# Patient Record
Sex: Female | Born: 1970 | Race: White | Hispanic: No | Marital: Single | Smoking: Never smoker
Health system: Southern US, Community
[De-identification: ages and names within clinical notes are randomized; demographics above are authoritative.]

## PROBLEM LIST (undated history)

## (undated) DIAGNOSIS — N289 Disorder of kidney and ureter, unspecified: Secondary | ICD-10-CM

## (undated) DIAGNOSIS — Z8719 Personal history of other diseases of the digestive system: Secondary | ICD-10-CM

## (undated) DIAGNOSIS — Z8709 Personal history of other diseases of the respiratory system: Secondary | ICD-10-CM

## (undated) DIAGNOSIS — L900 Lichen sclerosus et atrophicus: Secondary | ICD-10-CM

## (undated) DIAGNOSIS — N200 Calculus of kidney: Secondary | ICD-10-CM

## (undated) DIAGNOSIS — M109 Gout, unspecified: Secondary | ICD-10-CM

## (undated) DIAGNOSIS — E559 Vitamin D deficiency, unspecified: Secondary | ICD-10-CM

## (undated) HISTORY — DX: Gout, unspecified: M10.9

## (undated) HISTORY — PX: KIDNEY STONE SURGERY: SHX686

## (undated) HISTORY — DX: Personal history of other diseases of the digestive system: Z87.19

## (undated) HISTORY — DX: Personal history of other diseases of the respiratory system: Z87.09

## (undated) HISTORY — DX: Lichen sclerosus et atrophicus: L90.0

## (undated) HISTORY — DX: Vitamin D deficiency, unspecified: E55.9

---

## 2003-01-09 ENCOUNTER — Ambulatory Visit (HOSPITAL_BASED_OUTPATIENT_CLINIC_OR_DEPARTMENT_OTHER): Admission: RE | Admit: 2003-01-09 | Discharge: 2003-01-09 | Payer: Self-pay | Admitting: Urology

## 2004-09-23 ENCOUNTER — Other Ambulatory Visit: Admission: RE | Admit: 2004-09-23 | Discharge: 2004-09-23 | Payer: Self-pay | Admitting: Obstetrics and Gynecology

## 2006-07-06 ENCOUNTER — Encounter: Admission: RE | Admit: 2006-07-06 | Discharge: 2006-07-06 | Payer: Self-pay | Admitting: Orthopedic Surgery

## 2011-11-01 ENCOUNTER — Encounter: Payer: Self-pay | Admitting: Obstetrics and Gynecology

## 2012-05-17 ENCOUNTER — Telehealth: Payer: Self-pay | Admitting: Obstetrics and Gynecology

## 2012-05-17 ENCOUNTER — Encounter: Payer: Self-pay | Admitting: Obstetrics and Gynecology

## 2012-05-17 NOTE — Telephone Encounter (Signed)
TC to pt. Regarding fax request for RF Junel. Pt needs to schedule annual. TC to pt. LM to return call.

## 2012-05-17 NOTE — Progress Notes (Signed)
Pt has annual scheduled 06/07/12. Per protocol Rx for Junel Fe 1 po qd one pack with 0 Rf faxed to [facility], [address].

## 2012-05-17 NOTE — Telephone Encounter (Signed)
Returned pt's call,. States needs Rf for Junel by 05/20/12. Denies any problems. Transferred to appt to schedule annual and then will Rf until that time.

## 2014-07-22 ENCOUNTER — Other Ambulatory Visit: Payer: Self-pay | Admitting: Physician Assistant

## 2014-07-22 ENCOUNTER — Ambulatory Visit
Admission: RE | Admit: 2014-07-22 | Discharge: 2014-07-22 | Disposition: A | Discharge status: Home / Self Care | Payer: BC Managed Care – PPO | Source: Ambulatory Visit | Attending: Physician Assistant | Admitting: Physician Assistant

## 2014-07-22 DIAGNOSIS — R109 Unspecified abdominal pain: Secondary | ICD-10-CM

## 2018-01-01 DIAGNOSIS — F331 Major depressive disorder, recurrent, moderate: Secondary | ICD-10-CM

## 2018-01-01 DIAGNOSIS — F411 Generalized anxiety disorder: Secondary | ICD-10-CM

## 2018-01-01 DIAGNOSIS — F422 Mixed obsessional thoughts and acts: Secondary | ICD-10-CM | POA: Insufficient documentation

## 2018-01-09 ENCOUNTER — Other Ambulatory Visit: Payer: Self-pay | Admitting: Physician Assistant

## 2018-01-09 MED ORDER — FLUOXETINE HCL 60 MG PO TABS: 60.0000 mg | ORAL_TABLET | Freq: Every day | ORAL | 0 refills | Status: DC

## 2018-01-15 ENCOUNTER — Ambulatory Visit: Payer: BC Managed Care – PPO | Admitting: Physician Assistant

## 2018-01-15 ENCOUNTER — Encounter: Payer: Self-pay | Admitting: Physician Assistant

## 2018-01-15 DIAGNOSIS — F411 Generalized anxiety disorder: Secondary | ICD-10-CM | POA: Diagnosis not present

## 2018-01-15 DIAGNOSIS — F331 Major depressive disorder, recurrent, moderate: Secondary | ICD-10-CM

## 2018-01-15 DIAGNOSIS — F429 Obsessive-compulsive disorder, unspecified: Secondary | ICD-10-CM

## 2018-01-15 NOTE — Progress Notes (Signed)
[facility]  Patient ID: Jessica Edwards,  MRN: [id number]  PCP: Juluis Rainier, MD  Date of Evaluation: 01/15/2018 Time spent:15 minutes  Chief Complaint:  Chief Complaint    Follow-up      HISTORY/CURRENT STATUS: HPI For 6 week med check.  Has been doing well.  Is really stressed with work. 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  Sleeps fairly well.  Slept a lot this weekend, but thinks she needed the rest.  Patient denies loss of interest in usual activities and is able to enjoy things.  Denies decreased energy or motivation.  Appetite has not changed.  No extreme sadness, tearfulness, or feelings of hopelessness.  Denies any changes in concentration, making decisions or remembering things.  Denies suicidal or homicidal thoughts. At the last visit, we had increased the Wellbutrin.  She states it is helped with her energy and motivation.   OCD symptoms are pretty well controlled.  Still like things in order but not to the point where it is taking a lot of time out of her day.  Individual Medical History/ Review of Systems: Changes? :No   Allergies: Augmentin [amoxicillin-pot clavulanate]; Codeine; and Doxycycline  Current Medications:  Current Outpatient Medications:  .  buPROPion (WELLBUTRIN XL) 150 MG 24 hr tablet, Take 150 mg by mouth every morning., Disp: , Rfl:  .  buPROPion (WELLBUTRIN XL) 300 MG 24 hr tablet, Take 300 mg by mouth daily. Take with 150 mg tab, Disp: , Rfl:  .  cetirizine (ZYRTEC) 10 MG tablet, Take 10 mg by mouth daily., Disp: , Rfl:  .  FLUoxetine HCl 60 MG TABS, Take 60 mg by mouth daily., Disp: 90 tablet, Rfl: 0 .  spironolactone (ALDACTONE) 25 MG tablet, Take 25 mg by mouth daily., Disp: , Rfl:  Medication Side Effects: none  Family Medical/ Social History: Changes? No  MENTAL HEALTH EXAM:  There were no vitals  taken for this visit.There is no height or weight on file to calculate BMI.  General Appearance: Casual  Eye Contact:  Good  Speech:  Clear and Coherent  Volume:  Normal  Mood:  Euthymic  Affect:  Appropriate  Thought Process:  Coherent  Orientation:  Full (Time, Place, and Person)  Thought Content: Logical   Suicidal Thoughts:  No  Homicidal Thoughts:  No  Memory:  WNL  Judgement:  Good  Insight:  Good  Psychomotor Activity:  Normal  Concentration:  Concentration: Good  Recall:  Good  Fund of Knowledge: Good  Language: Good  Assets:  Desire for Improvement  ADL's:  Intact  Cognition: WNL  Prognosis:  Good    DIAGNOSES:    ICD-10-CM   1. Generalized anxiety disorder F41.1   2. Major depressive disorder, recurrent episode, moderate (HCC) F33.1   3. Obsessive-compulsive disorder, unspecified type F42.9     Receiving Psychotherapy: No    RECOMMENDATIONS: Continue medications as noted above. Return in 3 months or sooner as needed.   Melony Overly, PA-C

## 2018-01-22 ENCOUNTER — Emergency Department (HOSPITAL_COMMUNITY)
Admission: EM | Admit: 2018-01-22 | Discharge: 2018-01-22 | Disposition: A | Discharge status: Home / Self Care | Payer: BC Managed Care – PPO | Attending: Emergency Medicine | Admitting: Emergency Medicine

## 2018-01-22 ENCOUNTER — Other Ambulatory Visit: Payer: Self-pay

## 2018-01-22 ENCOUNTER — Encounter (HOSPITAL_COMMUNITY): Payer: Self-pay | Admitting: Emergency Medicine

## 2018-01-22 DIAGNOSIS — N289 Disorder of kidney and ureter, unspecified: Secondary | ICD-10-CM | POA: Insufficient documentation

## 2018-01-22 DIAGNOSIS — S61411A Laceration without foreign body of right hand, initial encounter: Secondary | ICD-10-CM | POA: Diagnosis present

## 2018-01-22 DIAGNOSIS — Y939 Activity, unspecified: Secondary | ICD-10-CM | POA: Insufficient documentation

## 2018-01-22 DIAGNOSIS — Y929 Unspecified place or not applicable: Secondary | ICD-10-CM | POA: Diagnosis not present

## 2018-01-22 DIAGNOSIS — Z79899 Other long term (current) drug therapy: Secondary | ICD-10-CM | POA: Diagnosis not present

## 2018-01-22 DIAGNOSIS — W25XXXA Contact with sharp glass, initial encounter: Secondary | ICD-10-CM | POA: Diagnosis not present

## 2018-01-22 DIAGNOSIS — Z23 Encounter for immunization: Secondary | ICD-10-CM | POA: Insufficient documentation

## 2018-01-22 DIAGNOSIS — Y999 Unspecified external cause status: Secondary | ICD-10-CM | POA: Diagnosis not present

## 2018-01-22 HISTORY — DX: Disorder of kidney and ureter, unspecified: N28.9

## 2018-01-22 HISTORY — DX: Calculus of kidney: N20.0

## 2018-01-22 MED ORDER — BACITRACIN ZINC 500 UNIT/GM EX OINT
TOPICAL_OINTMENT | CUTANEOUS | Status: AC
  Administered 2018-01-22: 02:00:00
  Filled 2018-01-22: qty 0.9

## 2018-01-22 MED ORDER — TETANUS-DIPHTH-ACELL PERTUSSIS 5-2.5-18.5 LF-MCG/0.5 IM SUSP
0.5000 mL | Freq: Once | INTRAMUSCULAR | Status: AC
  Administered 2018-01-22: 0.5 mL via INTRAMUSCULAR
  Filled 2018-01-22: qty 0.5

## 2018-01-22 MED ORDER — LIDOCAINE-EPINEPHRINE (PF) 2 %-1:200000 IJ SOLN: 20.0000 mL | Freq: Once | INTRAMUSCULAR | Status: DC

## 2018-01-22 MED ORDER — LIDOCAINE HCL 2 % IJ SOLN
INTRAMUSCULAR | Status: AC
  Administered 2018-01-22: 400 mg
  Filled 2018-01-22: qty 20

## 2018-01-22 NOTE — ED Provider Notes (Signed)
[facility]-EMERGENCY DEPT Provider Note   CSN: [id number] Arrival date & time: 01/22/18  0005     History   Chief Complaint Chief Complaint  Patient presents with  . Laceration    R Hand    HPI Jessica Edwards is a 47 y.o. female.  Patient presents to the emergency department with a chief complaint of right hand laceration.  She states that she cut her hand on a glass mug that had been broken.  She sustained a laceration on the back of her right hand.  She denies any numbness or tingling.  Denies any difficulty moving her fingers.  Last tetanus shot unknown.  Bleeding controlled with gauze.  The history is provided by the patient. No language interpreter was used.    Past Medical History:  Diagnosis Date  . Kidney stones   . Renal disorder     Patient Active Problem List   Diagnosis Date Noted  . Major depressive disorder, recurrent episode, moderate (HCC) 01/01/2018  . Mixed obsessional thoughts and acts 01/01/2018  . GAD (generalized anxiety disorder) 01/01/2018    History reviewed. No pertinent surgical history.   OB History   None      Home Medications    Prior to Admission medications   Medication Sig Start Date End Date Taking? Authorizing Provider  buPROPion (WELLBUTRIN XL) 150 MG 24 hr tablet Take 150 mg by mouth every morning. 10/19/17   [provider]  buPROPion (WELLBUTRIN XL) 300 MG 24 hr tablet Take 300 mg by mouth daily. Take with 150 mg tab 10/19/17   [provider]  cetirizine (ZYRTEC) 10 MG tablet Take 10 mg by mouth daily.    [provider]  FLUoxetine HCl 60 MG TABS Take 60 mg by mouth daily. 01/09/18   Cherie OuchHurst, Teresa T, PA-C  spironolactone (ALDACTONE) 25 MG tablet Take 25 mg by mouth daily.    [provider]    Family History No family history on file.  Social History Social History   Tobacco Use  . Smoking status: Never Smoker  . Smokeless tobacco: Never Used  Substance  Use Topics  . Alcohol use: Yes    Comment: maybe 1-2 year  . Drug use: Never     Allergies   Augmentin [amoxicillin-pot clavulanate]; Codeine; and Doxycycline   Review of Systems Review of Systems  All other systems reviewed and are negative.    Physical Exam Updated Vital Signs BP (!) 148/104 (BP Location: Right Arm)   Pulse 88   Temp 97.8 F (36.6 C) (Oral)   Resp 16   SpO2 99%   Physical Exam  Constitutional: She is oriented to person, place, and time. No distress.  HENT:  Head: Normocephalic and atraumatic.  Eyes: Pupils are equal, round, and reactive to light. Conjunctivae and EOM are normal.  Neck: No tracheal deviation present.  Cardiovascular: Normal rate.  Pulmonary/Chest: Effort normal. No respiratory distress.  Abdominal: Soft.  Musculoskeletal: Normal range of motion.  Range of motion and strength of right fingers 5/5, isolated at all joints  Neurological: She is alert and oriented to person, place, and time.  Skin: Skin is warm and dry. She is not diaphoretic.  3 cm laceration to dorsal aspect of right hand, fairly shallow, no deep structure involvement, no foreign body  Psychiatric: Judgment normal.  Nursing note and vitals reviewed.    ED Treatments / Results  Labs (all labs ordered are listed, but only abnormal results are displayed)  Labs Reviewed - No data to display  EKG None  Radiology No results found.  Procedures Procedures (including critical care time) LACERATION REPAIR Performed by: Roxy Horseman Authorized by: Roxy Horseman Consent: Verbal consent obtained. Risks and benefits: risks, benefits and alternatives were discussed Consent given by: patient Patient identity confirmed: provided demographic data Prepped and Draped in normal sterile fashion Wound explored  Laceration Location: right hand  Laceration Length: 2 cm  No Foreign Bodies seen or palpated  Anesthesia: local infiltration  Local anesthetic: lidocaine  2% with epinephrine  Anesthetic total: 2 ml  Irrigation method: syringe Amount of cleaning: standard  Skin closure: 5-0 prolene  Number of sutures: 5  Technique: interrupted  Patient tolerance: Patient tolerated the procedure well with no immediate complications.  Medications Ordered in ED Medications  Tdap (BOOSTRIX) injection 0.5 mL (has no administration in time range)  lidocaine-EPINEPHrine (XYLOCAINE W/EPI) 2 %-1:200000 (PF) injection 20 mL (has no administration in time range)     Initial Impression / Assessment and Plan / ED Course  I have reviewed the triage vital signs and the nursing notes.  Pertinent labs & imaging results that were available during my care of the patient were reviewed by me and considered in my medical decision making (see chart for details).     Patient with minor laceration to right hand.  Laceration repaired in the ED.  Tetanus shot updated.  Final Clinical Impressions(s) / ED Diagnoses   Final diagnoses:  Laceration of right hand without foreign body, initial encounter    ED Discharge Orders    None       Roxy Horseman, PA-C 01/22/18 0123    Dione Booze, MD 01/22/18 [phone]

## 2018-01-22 NOTE — ED Triage Notes (Signed)
Patient states she was helping her father with the trash when she cut her posterior hand on a broken mug. Laceration is 2 cm long and 0.5 cm wide. Patient is able to move thumb and has feeling. Minimal blood loss. Patient will need new tetanus shot.

## 2018-04-11 ENCOUNTER — Encounter: Payer: Self-pay | Admitting: Physician Assistant

## 2018-04-11 ENCOUNTER — Ambulatory Visit: Payer: BC Managed Care – PPO | Admitting: Physician Assistant

## 2018-04-11 DIAGNOSIS — F411 Generalized anxiety disorder: Secondary | ICD-10-CM

## 2018-04-11 DIAGNOSIS — F331 Major depressive disorder, recurrent, moderate: Secondary | ICD-10-CM

## 2018-04-11 DIAGNOSIS — F422 Mixed obsessional thoughts and acts: Secondary | ICD-10-CM | POA: Diagnosis not present

## 2018-04-11 MED ORDER — BUPROPION HCL ER (XL) 150 MG PO TB24: 450.0000 mg | ORAL_TABLET | Freq: Every day | ORAL | 1 refills | Status: DC

## 2018-04-11 MED ORDER — FLUOXETINE HCL 40 MG PO CAPS: 80.0000 mg | ORAL_CAPSULE | Freq: Every day | ORAL | 1 refills | Status: DC

## 2018-04-11 NOTE — Progress Notes (Signed)
[facility]  Patient ID: Jessica Edwards,  MRN: [id number]  PCP: Juluis Rainier, MD  Date of Evaluation: 04/11/2018 Time spent:15 minutes  Chief Complaint:  Chief Complaint    Follow-up      HISTORY/CURRENT STATUS: HPI Here for routine med check.   Has been really stressed lately.  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 Has been putting it off.   Patient has lost interest in usual activities is unable to enjoy things.  Has decreased energy and motivation.  Appetite has not changed.  No extreme sadness, tearfulness, or feelings of hopelessness.  Denies any changes in concentration, making decisions or remembering things.  Denies suicidal or homicidal thoughts.   Individual Medical History/ Review of Systems: Changes? :No    Past medications for mental health diagnoses include: Cymbalta, Xanax  Allergies: Augmentin [amoxicillin-pot clavulanate]; Codeine; and Doxycycline  Current Medications:  Current Outpatient Medications:  .  buPROPion (WELLBUTRIN XL) 150 MG 24 hr tablet, Take 150 mg by mouth every morning., Disp: , Rfl:  .  buPROPion (WELLBUTRIN XL) 300 MG 24 hr tablet, Take 300 mg by mouth daily. Take with 150 mg tab, Disp: , Rfl:  .  cetirizine (ZYRTEC) 10 MG tablet, Take 10 mg by mouth daily., Disp: , Rfl:  .  FLUoxetine HCl 60 MG TABS, Take 60 mg by mouth daily., Disp: 90 tablet, Rfl: 0 .  spironolactone (ALDACTONE) 25 MG tablet, Take 25 mg by mouth daily., Disp: , Rfl:  Medication Side Effects: none  Family Medical/ Social History: Changes? No  MENTAL HEALTH EXAM:  There were no vitals taken for this visit.There is no height or weight on file to calculate BMI.  General Appearance: Casual and Well Groomed  Eye Contact:  Good  Speech:  Clear and Coherent  Volume:  Normal  Mood:  Euthymic  Affect:  Appropriate  Thought Process:  Goal Directed  Orientation:  Full (Time, Place, and Person)  Thought Content: Logical   Suicidal  Thoughts:  No  Homicidal Thoughts:  No  Memory:  WNL  Judgement:  Good  Insight:  Good  Psychomotor Activity:  Normal  Concentration:  Concentration: Good  Recall:  Good  Fund of Knowledge: Good  Language: Good  Assets:  Desire for Improvement  ADL's:  Intact  Cognition: WNL  Prognosis:  Good    DIAGNOSES:    ICD-10-CM   1. Major depressive disorder, recurrent episode, moderate (HCC) F33.1   2. Mixed obsessional thoughts and acts F42.2   3. GAD (generalized anxiety disorder) F41.1     Receiving Psychotherapy: No    RECOMMENDATIONS: Increase Prozac to 80mg  qs. Cont Wellbutrin XL 450mg  q am.  If still having increased fatigue and low motivation in a month, will add Abilify. Recommend get back in therapy. Return in 4 weeks.    Melony Overly, PA-C

## 2018-04-11 NOTE — Patient Instructions (Signed)
Continue Wellbutrin XL 450 mg daily. Increase Prozac to a total of 80 mg daily.  We will use that for a month to 6 weeks and if there are still no improvement with mood and energy then we will consider adding Abilify at a low dose.

## 2018-05-03 ENCOUNTER — Telehealth: Payer: Self-pay | Admitting: Physician Assistant

## 2018-05-03 NOTE — Telephone Encounter (Signed)
Decrease Prozac back to 40mg .  Since she'll see me next week, it's not necessary to go to 60mg , and send in that Rx....she and I can decide that next week. Has she seen her PCP?  She needs to do that too.

## 2018-05-03 NOTE — Telephone Encounter (Signed)
Patient called and said that she is experiencing dizzyness and tripping which she has never done. Patient feels like this is due to the increase of the prozac. Please give patient a call at [phone]. Next appt is march 4th

## 2018-05-04 NOTE — Telephone Encounter (Signed)
Pt given recommendation and verbalized understanding. Instructed to go to ER if sx's get worse over weekend.

## 2018-05-07 ENCOUNTER — Ambulatory Visit: Payer: BC Managed Care – PPO | Admitting: Physician Assistant

## 2018-05-09 ENCOUNTER — Ambulatory Visit: Payer: BC Managed Care – PPO | Admitting: Physician Assistant

## 2018-05-09 VITALS — BP 152/74 | HR 71

## 2018-05-09 DIAGNOSIS — F331 Major depressive disorder, recurrent, moderate: Secondary | ICD-10-CM | POA: Diagnosis not present

## 2018-05-09 DIAGNOSIS — F411 Generalized anxiety disorder: Secondary | ICD-10-CM | POA: Diagnosis not present

## 2018-05-09 DIAGNOSIS — F422 Mixed obsessional thoughts and acts: Secondary | ICD-10-CM

## 2018-05-09 MED ORDER — FLUOXETINE HCL 40 MG PO CAPS: 80.0000 mg | ORAL_CAPSULE | Freq: Every day | ORAL | 1 refills | Status: DC

## 2018-05-09 NOTE — Patient Instructions (Signed)
Increase Prozac to 40 mg, 2 daily.

## 2018-05-09 NOTE — Progress Notes (Signed)
[facility]  Patient ID: Jessica Edwards,  MRN: [id number]  PCP: Juluis Rainier, MD  Date of Evaluation: 05/09/18 Time spent:15 minutes  Chief Complaint:  Chief Complaint    Follow-up      HISTORY/CURRENT STATUS: HPI  Here for routine med check.  Since LOV, had 'off-balance' episodes and called [place]. We decreased the Prozac back.   She also had started back taking the Spironolactone at the same time, after having been off for several months. Takes it for acne. No syncope.  States she feels much better as far as the imbalance goes.  She is never had that before when she was on the Spironolactone or when she first started the Prozac.  States she was feeling better with motivation and energy and enjoying things more when we increased the Prozac.  "It is the same we had to decrease the dose again."  Right now, she is not feeling as bad as she did before the last visit in February but she can tell she is not on the higher dose of the Prozac.  She denies any suicidal thoughts.  He sleeps well most of the time.  States her mom who has dementia may be getting worse.  That of course is stressful and sad.  Patient is not obsessing over things as much as she was.  No constant worry over things she cannot control  Denies muscle or joint pain, stiffness, or dystonia.  Denies dizziness, syncope, seizures, numbness, tingling, tremor, tics, unsteady gait, slurred speech, confusion.   Individual Medical History/ Review of Systems: Changes? :No    Past medications for mental health diagnoses include: Cymbalta, Xanax  Allergies: Augmentin [amoxicillin-pot clavulanate]; Codeine; and Doxycycline  Current Medications:  Current Outpatient Medications:  .  buPROPion (WELLBUTRIN XL) 150 MG 24 hr tablet, Take 3 tablets (450 mg total) by mouth daily., Disp: 270 tablet, Rfl: 1 .  cetirizine (ZYRTEC) 10 MG tablet, Take 10 mg by mouth daily., Disp: , Rfl:  .  FLUoxetine (PROZAC) 40 MG capsule,  Take 2 capsules (80 mg total) by mouth daily., Disp: 180 capsule, Rfl: 1 .  spironolactone (ALDACTONE) 25 MG tablet, Take 25 mg by mouth daily., Disp: , Rfl:  Medication Side Effects: none  Family Medical/ Social History: Changes? No  MENTAL HEALTH EXAM:  Blood pressure (!) 152/74, pulse 71.There is no height or weight on file to calculate BMI.  General Appearance: Casual and Well Groomed  Eye Contact:  Good  Speech:  Clear and Coherent  Volume:  Normal  Mood:  Euthymic  Affect:  Appropriate  Thought Process:  Goal Directed  Orientation:  Full (Time, Place, and Person)  Thought Content: Logical   Suicidal Thoughts:  No  Homicidal Thoughts:  No  Memory:  WNL  Judgement:  Good  Insight:  Good  Psychomotor Activity:  Normal  Concentration:  Concentration: Good  Recall:  Good  Fund of Knowledge: Good  Language: Good  Assets:  Desire for Improvement  ADL's:  Intact  Cognition: WNL  Prognosis:  Good    DIAGNOSES:    ICD-10-CM   1. Major depressive disorder, recurrent episode, moderate (HCC) F33.1   2. GAD (generalized anxiety disorder) F41.1   3. Mixed obsessional thoughts and acts F42.2     Receiving Psychotherapy: No    RECOMMENDATIONS: I recommend that we will retry the Prozac at the higher dose.  My gut feeling is that when she started the spironolactone back that she may have had some orthostatic  hypotension due to that medication.  Of course there is no way to prove that.  She is willing to retry the Prozac at the higher dose because she did feel much better.  She should let me know if those symptoms recur and she can just decrease back down to 40 mg of the Prozac, or 60 mg would be better.  She will need to call so I can send in a 20 mg pill for her to take in addition to the 40. Continue Wellbutrin XL 450 mg p.o. daily. Return in 6 weeks.  Melony Overly, PA-C   This record has been created using [employer].  Chart creation errors have been sought, but may not  always have been located and corrected. Such creation errors do not reflect on the standard of medical care.

## 2018-05-10 ENCOUNTER — Encounter: Payer: Self-pay | Admitting: Physician Assistant

## 2018-06-20 ENCOUNTER — Encounter: Payer: Self-pay | Admitting: Physician Assistant

## 2018-06-20 ENCOUNTER — Ambulatory Visit (INDEPENDENT_AMBULATORY_CARE_PROVIDER_SITE_OTHER): Payer: BC Managed Care – PPO | Admitting: Physician Assistant

## 2018-06-20 ENCOUNTER — Other Ambulatory Visit: Payer: Self-pay

## 2018-06-20 DIAGNOSIS — F331 Major depressive disorder, recurrent, moderate: Secondary | ICD-10-CM | POA: Diagnosis not present

## 2018-06-20 DIAGNOSIS — F429 Obsessive-compulsive disorder, unspecified: Secondary | ICD-10-CM | POA: Diagnosis not present

## 2018-06-20 DIAGNOSIS — F411 Generalized anxiety disorder: Secondary | ICD-10-CM

## 2018-06-20 NOTE — Progress Notes (Signed)
[facility]  Patient ID: Jessica Edwards,  MRN: [id number]  PCP: Juluis Rainier, MD  Date of Evaluation: 06/20/2018 Time spent:15 minutes  Chief Complaint:  Chief Complaint    Follow-up     Virtual Visit via Telephone Note  I connected with Jessica Edwards on 06/20/18 at  4:30 PM EDT by telephone and verified that I am speaking with the correct person using two identifiers.   I discussed the limitations, risks, security and privacy concerns of performing an evaluation and management service by telephone and the availability of in person appointments. I also discussed with the patient that there may be a patient responsible charge related to this service. The patient expressed understanding and agreed to proceed.   HISTORY/CURRENT STATUS: HPI For 6 week med check.  While going over her meds, we realized she's taking the Prozac wrong, a total of 160mg /day. Has felt dizzy and h/a, nauseated.  She's been taking it for 6 weeks, since the last visit.  Patient says the good thing about it is that she has felt more motivated and energetic.  When she feels good physically, she has even vacuums the floor and major dishes got in the dishwasher like they are supposed to.  She denies any suicidal thoughts.  She was able to finish the big project that she had to do for her career.  She is glad to have that behind her.  She is not isolating except as required because of the coronavirus pandemic.  She is not crying easily.  Denies muscle or joint pain, stiffness, or dystonia.  Denies syncope, seizures, numbness, tingling, tremor, tics, unsteady gait, slurred speech, confusion.   Individual Medical History/ Review of Systems: Changes? :Yes  see HPI   Past medications for mental health diagnoses include: Cymbalta, Xanax  Allergies: Augmentin [amoxicillin-pot clavulanate]; Codeine; and Doxycycline  Current Medications:  Current Outpatient Medications:  .  buPROPion (WELLBUTRIN  XL) 150 MG 24 hr tablet, Take 3 tablets (450 mg total) by mouth daily., Disp: 270 tablet, Rfl: 1 .  cetirizine (ZYRTEC) 10 MG tablet, Take 10 mg by mouth daily., Disp: , Rfl:  .  FLUoxetine (PROZAC) 40 MG capsule, Take 2 capsules (80 mg total) by mouth daily., Disp: 180 capsule, Rfl: 1 .  spironolactone (ALDACTONE) 25 MG tablet, Take 25 mg by mouth daily., Disp: , Rfl:  Medication Side Effects: none except w/ high dose Prozac  Family Medical/ Social History: Changes?  Working from home now because of the coronavirus pandemic  MENTAL HEALTH EXAM:  There were no vitals taken for this visit.There is no height or weight on file to calculate BMI.  General Appearance: Phone visit, unable to assess  Eye Contact:  Unable to assess  Speech:  Clear and Coherent  Volume:  Normal  Mood:  Euthymic  Affect:  Unable to assess  Thought Process:  Goal Directed  Orientation:  Full (Time, Place, and Person)  Thought Content: Logical   Suicidal Thoughts:  No  Homicidal Thoughts:  No  Memory:  WNL  Judgement:  Good  Insight:  Good  Psychomotor Activity:  Unable to assess  Concentration:  Concentration: Good  Recall:  Good  Fund of Knowledge: Good  Language: Good  Assets:  Desire for Improvement  ADL's:  Intact  Cognition: WNL  Prognosis:  Good    DIAGNOSES:    ICD-10-CM   1. Major depressive disorder, recurrent episode, moderate (HCC) F33.1   2. Obsessive-compulsive disorder, unspecified type F42.9   3.  GAD (generalized anxiety disorder) F41.1     Receiving Psychotherapy: No    RECOMMENDATIONS: Stop Prozac for 2 weeks and then restart at 80 mg.  She verbalizes understanding.  Call if there are problems. Continue Wellbutrin XL 450 mg daily. Return in 4 weeks.  Melony Overlyeresa Darbie Biancardi, PA-C   This record has been created using [employer] software.  Chart creation errors have been sought, but may not always have been located and corrected. Such creation errors do not reflect on the standard of medical  care.

## 2018-06-21 ENCOUNTER — Other Ambulatory Visit: Payer: Self-pay | Admitting: Physician Assistant

## 2018-07-17 ENCOUNTER — Ambulatory Visit (INDEPENDENT_AMBULATORY_CARE_PROVIDER_SITE_OTHER): Payer: BC Managed Care – PPO | Admitting: Physician Assistant

## 2018-07-17 ENCOUNTER — Other Ambulatory Visit: Payer: Self-pay

## 2018-07-17 ENCOUNTER — Encounter: Payer: Self-pay | Admitting: Physician Assistant

## 2018-07-17 DIAGNOSIS — F331 Major depressive disorder, recurrent, moderate: Secondary | ICD-10-CM | POA: Diagnosis not present

## 2018-07-17 DIAGNOSIS — F429 Obsessive-compulsive disorder, unspecified: Secondary | ICD-10-CM

## 2018-07-17 NOTE — Progress Notes (Signed)
[facility]  Patient ID: Jessica Edwards,  MRN: [id number]  PCP: Juluis Rainier, MD  Date of Evaluation: 07/17/2018 Time spent:15 minutes  Chief Complaint:  Chief Complaint    Follow-up     Virtual Visit via Telephone Note  I connected with patient by a video enabled telemedicine application or telephone, with their informed consent, and verified patient privacy and that I am speaking with the correct person using two identifiers.  I am private, in my home and the patient is home.   I discussed the limitations, risks, security and privacy concerns of performing an evaluation and management service by telephone and the availability of in person appointments. I also discussed with the patient that there may be a patient responsible charge related to this service. The patient expressed understanding and agreed to proceed.   I discussed the assessment and treatment plan with the patient. The patient was provided an opportunity to ask questions and all were answered. The patient agreed with the plan and demonstrated an understanding of the instructions.   The patient was advised to call back or seek an in-person evaluation if the symptoms worsen or if the condition fails to improve as anticipated.  I provided 15 minutes of non-face-to-face time during this encounter.  HISTORY/CURRENT STATUS: HPI for 1 month med check.  She was accidentally taking double the dose of Prozac, we discovered at the last appt.  We decreased it and now she feels much better.  Not dizzy and feeling sluggish and unbalanced.   Patient denies loss of interest in usual activities and is able to enjoy things.  Denies decreased energy or motivation.  Appetite has not changed.  No extreme sadness, tearfulness, or feelings of hopelessness.  Denies any changes in concentration, making decisions or remembering things.  Denies suicidal or homicidal thoughts.  Anxiety is well controlled. Sleeps well.  Feels  that OCD symptoms are pretty well controlled.  Denies dizziness, syncope, seizures, numbness, tingling, tremor, tics, unsteady gait, slurred speech, confusion.  Denies muscle or joint pain, stiffness, or dystonia.  Individual Medical History/ Review of Systems: Changes? :No    Past medications for mental health diagnoses include: Cymbalta, Xanax  Allergies: Augmentin [amoxicillin-pot clavulanate]; Codeine; and Doxycycline  Current Medications:  Current Outpatient Medications:  .  buPROPion (WELLBUTRIN XL) 150 MG 24 hr tablet, Take 3 tablets (450 mg total) by mouth daily., Disp: 270 tablet, Rfl: 1 .  cetirizine (ZYRTEC) 10 MG tablet, Take 10 mg by mouth daily., Disp: , Rfl:  .  FLUoxetine (PROZAC) 40 MG capsule, Take 2 capsules (80 mg total) by mouth daily., Disp: 180 capsule, Rfl: 1 .  spironolactone (ALDACTONE) 25 MG tablet, Take 25 mg by mouth daily., Disp: , Rfl:  Medication Side Effects: none  Family Medical/ Social History: Changes? Yes working from home due to coronavirus pandemic  MENTAL HEALTH EXAM:  There were no vitals taken for this visit.There is no height or weight on file to calculate BMI.  General Appearance: unable to assess  Eye Contact:  unable to assess  Speech:  Clear and Coherent  Volume:  Normal  Mood:  Euthymic  Affect:  unable to assess  Thought Process:  Goal Directed  Orientation:  Full (Time, Place, and Person)  Thought Content: Logical   Suicidal Thoughts:  No  Homicidal Thoughts:  No  Memory:  WNL  Judgement:  Good  Insight:  Good  Psychomotor Activity:  unable to assess  Concentration:  Concentration: Good  Recall:  Good  Fund of Knowledge: Good  Language: Good  Assets:  Desire for Improvement  ADL's:  Intact  Cognition: WNL  Prognosis:  Good    DIAGNOSES:    ICD-10-CM   1. Obsessive-compulsive disorder, unspecified type F42.9   2. Major depressive disorder, recurrent episode, moderate (HCC) F33.1     Receiving Psychotherapy: Yes     RECOMMENDATIONS: I am glad to see that she is doing better off of such a high dose of Prozac.  She would really like to get off of at least 1 of the antidepressants.  Her OCD can be pretty bad though went untreated.  Since it is only been 4 weeks since we discovered she was over taking the Prozac and the dose has now been increased to safe levels, we will readdress this at the next appointment.  If we do decide to wean it will be the Wellbutrin. Continue Prozac 80 mg daily. Continue Wellbutrin XL 450 mg p.o. daily Return in 6 weeks.   Melony Overlyeresa Kemar Pandit, PA-C

## 2018-09-28 ENCOUNTER — Other Ambulatory Visit: Payer: Self-pay | Admitting: Family Medicine

## 2018-09-28 DIAGNOSIS — R350 Frequency of micturition: Secondary | ICD-10-CM

## 2018-09-28 DIAGNOSIS — N390 Urinary tract infection, site not specified: Secondary | ICD-10-CM

## 2018-10-05 ENCOUNTER — Other Ambulatory Visit: Payer: Self-pay | Admitting: Physician Assistant

## 2018-10-10 ENCOUNTER — Other Ambulatory Visit: Payer: BC Managed Care – PPO

## 2018-10-29 ENCOUNTER — Ambulatory Visit (INDEPENDENT_AMBULATORY_CARE_PROVIDER_SITE_OTHER): Payer: BC Managed Care – PPO | Admitting: Physician Assistant

## 2018-10-29 ENCOUNTER — Encounter: Payer: Self-pay | Admitting: Physician Assistant

## 2018-10-29 ENCOUNTER — Other Ambulatory Visit: Payer: Self-pay

## 2018-10-29 DIAGNOSIS — F331 Major depressive disorder, recurrent, moderate: Secondary | ICD-10-CM | POA: Diagnosis not present

## 2018-10-29 DIAGNOSIS — F411 Generalized anxiety disorder: Secondary | ICD-10-CM

## 2018-10-29 DIAGNOSIS — F422 Mixed obsessional thoughts and acts: Secondary | ICD-10-CM

## 2018-10-29 NOTE — Progress Notes (Signed)
[facility]  Patient ID: Jessica Edwards,  MRN: [id number]  PCP: Juluis RainierBarnes, Elizabeth, MD  Date of Evaluation: 10/29/2018 Time spent:15 minutes  Chief Complaint:  Chief Complaint    Follow-up     Virtual Visit via Telephone Note  I connected with patient by a video enabled telemedicine application or telephone, with their informed consent, and verified patient privacy and that I am speaking with the correct person using two identifiers.  I am private, in my home and the patient is home.   I discussed the limitations, risks, security and privacy concerns of performing an evaluation and management service by telephone and the availability of in person appointments. I also discussed with the patient that there may be a patient responsible charge related to this service. The patient expressed understanding and agreed to proceed.   I discussed the assessment and treatment plan with the patient. The patient was provided an opportunity to ask questions and all were answered. The patient agreed with the plan and demonstrated an understanding of the instructions.   The patient was advised to call back or seek an in-person evaluation if the symptoms worsen or if the condition fails to improve as anticipated.  I provided 15 minutes of non-face-to-face time during this encounter.  HISTORY/CURRENT STATUS: HPI for routine med check.  Had a good summer.  She had 3 weeks all to herself, which was good. Hasn't had that in several years. Has been kind of down at times b/c of what's going on in the world. But overall is ok.  Patient denies loss of interest in usual activities and is able to enjoy things.  Denies decreased energy or motivation.  Appetite has not changed.  No extreme sadness, tearfulness, or feelings of hopelessness.  Denies any changes in concentration, making decisions or remembering things.  Denies suicidal or homicidal thoughts.  Anxiety is controlled.  Sleeps well. OCD sx  are controlled.   Denies dizziness, syncope, seizures, numbness, tingling, tremor, tics, unsteady gait, slurred speech, confusion. Denies muscle or joint pain, stiffness, or dystonia.  Individual Medical History/ Review of Systems: Changes? :No    Past medications for mental health diagnoses include: Cymbalta, Xanax  Allergies: Augmentin [amoxicillin-pot clavulanate], Codeine, and Doxycycline  Current Medications:  Current Outpatient Medications:  .  buPROPion (WELLBUTRIN XL) 150 MG 24 hr tablet, TAKE 3 TABLETS BY MOUTH EVERY DAY, Disp: 270 tablet, Rfl: 0 .  cetirizine (ZYRTEC) 10 MG tablet, Take 10 mg by mouth daily., Disp: , Rfl:  .  Clindamycin Phosphate foam, clindamycin 1 % topical foam, Disp: , Rfl:  .  clobetasol ointment (TEMOVATE) 0.05 %, clobetasol 0.05 % topical ointment, Disp: , Rfl:  .  FLUoxetine (PROZAC) 40 MG capsule, Take 2 capsules (80 mg total) by mouth daily., Disp: 180 capsule, Rfl: 1 .  tretinoin (RETIN-A) 0.025 % cream, tretinoin 0.025 % topical cream, Disp: , Rfl:  .  spironolactone (ALDACTONE) 25 MG tablet, Take 25 mg by mouth daily., Disp: , Rfl:  Medication Side Effects: none  Family Medical/ Social History: Changes? No  MENTAL HEALTH EXAM:  There were no vitals taken for this visit.There is no height or weight on file to calculate BMI.  General Appearance: unable to assess  Eye Contact:  unable to assess  Speech:  Clear and Coherent  Volume:  Normal  Mood:  Euthymic  Affect:  unable to assess  Thought Process:  Goal Directed  Orientation:  Full (Time, Place, and Person)  Thought Content: Logical  Suicidal Thoughts:  No  Homicidal Thoughts:  No  Memory:  WNL  Judgement:  Good  Insight:  Good  Psychomotor Activity:  unable to assess  Concentration:  Concentration: Good  Recall:  Good  Fund of Knowledge: Good  Language: Good  Assets:  Desire for Improvement  ADL's:  Intact  Cognition: WNL  Prognosis:  Good    DIAGNOSES:    ICD-10-CM   1.  Mixed obsessional thoughts and acts  F42.2   2. Major depressive disorder, recurrent episode, moderate (HCC)  F33.1   3. Generalized anxiety disorder  F41.1     Receiving Psychotherapy: No    RECOMMENDATIONS:  Continue Wellbutrin XL 150 mg, 3 qd Continue Prozac 40 mg, 2 qd. Return in 3 months.   Donnal Moat, PA-C   This record has been created using [employer].  Chart creation errors have been sought, but may not always have been located and corrected. Such creation errors do not reflect on the standard of medical care.

## 2018-12-04 ENCOUNTER — Other Ambulatory Visit: Payer: Self-pay | Admitting: Physician Assistant

## 2018-12-31 ENCOUNTER — Other Ambulatory Visit: Payer: Self-pay | Admitting: Physician Assistant

## 2019-01-07 ENCOUNTER — Other Ambulatory Visit: Payer: Self-pay

## 2019-01-07 ENCOUNTER — Ambulatory Visit (INDEPENDENT_AMBULATORY_CARE_PROVIDER_SITE_OTHER): Payer: BC Managed Care – PPO | Admitting: Physician Assistant

## 2019-01-07 ENCOUNTER — Encounter: Payer: Self-pay | Admitting: Physician Assistant

## 2019-01-07 DIAGNOSIS — F411 Generalized anxiety disorder: Secondary | ICD-10-CM | POA: Diagnosis not present

## 2019-01-07 DIAGNOSIS — F429 Obsessive-compulsive disorder, unspecified: Secondary | ICD-10-CM | POA: Diagnosis not present

## 2019-01-07 NOTE — Progress Notes (Signed)
[facility]  Patient ID: OLEVA KOO,  MRN: [id number]  PCP: Leighton Ruff, MD  Date of Evaluation: 01/07/2019 Time spent:15 minutes  Chief Complaint:  Chief Complaint    Follow-up      HISTORY/CURRENT STATUS: HPI for routine med check.  Patient denies loss of interest in usual activities and is able to enjoy things.  Energy and motivation are a little low, but unsure if it's the time of year, or what. "I'm a lot better than I was."  Appetite has not changed.  No extreme sadness, tearfulness, or feelings of hopelessness.  Denies any changes in concentration, making decisions or remembering things.  Denies suicidal or homicidal thoughts.  Anxiety is controlled.  Sleeps well. OCD sx are controlled. Work is going okay.   Denies dizziness, syncope, seizures, numbness, tingling, tremor, tics, unsteady gait, slurred speech, confusion. Denies muscle or joint pain, stiffness, or dystonia.  Individual Medical History/ Review of Systems: Changes? :No    Past medications for mental health diagnoses include: Cymbalta, Xanax  Allergies: Augmentin [amoxicillin-pot clavulanate], Codeine, and Doxycycline  Current Medications:  Current Outpatient Medications:  .  buPROPion (WELLBUTRIN XL) 150 MG 24 hr tablet, TAKE 3 TABLETS BY MOUTH EVERY DAY, Disp: 270 tablet, Rfl: 0 .  cetirizine (ZYRTEC) 10 MG tablet, Take 10 mg by mouth daily., Disp: , Rfl:  .  Clindamycin Phosphate foam, clindamycin 1 % topical foam, Disp: , Rfl:  .  clobetasol ointment (TEMOVATE) 0.05 %, clobetasol 0.05 % topical ointment, Disp: , Rfl:  .  FLUoxetine (PROZAC) 40 MG capsule, TAKE 2 CAPSULES BY MOUTH DAILY, Disp: 180 capsule, Rfl: 1 .  spironolactone (ALDACTONE) 25 MG tablet, Take 25 mg by mouth daily., Disp: , Rfl:  .  tretinoin (RETIN-A) 0.025 % cream, tretinoin 0.025 % topical cream, Disp: , Rfl:  Medication Side Effects: none  Family Medical/ Social History: Changes? No  MENTAL HEALTH  EXAM:  There were no vitals taken for this visit.There is no height or weight on file to calculate BMI.  General Appearance: Casual, Neat and Well Groomed  Eye Contact:  Good  Speech:  Clear and Coherent  Volume:  Normal  Mood:  Euthymic  Affect:  Appropriate  Thought Process:  Goal Directed and Descriptions of Associations: Intact  Orientation:  Full (Time, Place, and Person)  Thought Content: Logical   Suicidal Thoughts:  No  Homicidal Thoughts:  No  Memory:  WNL  Judgement:  Good  Insight:  Good  Psychomotor Activity:  Normal  Concentration:  Concentration: Good  Recall:  Good  Fund of Knowledge: Good  Language: Good  Assets:  Desire for Improvement  ADL's:  Intact  Cognition: WNL  Prognosis:  Good    DIAGNOSES:    ICD-10-CM   1. Obsessive-compulsive disorder, unspecified type  F42.9   2. GAD (generalized anxiety disorder)  F41.1     Receiving Psychotherapy: No    RECOMMENDATIONS:  Continue Wellbutrin XL 150 mg, 3 qd Continue Prozac 40 mg, 2 qd. Return in 3 months.   Donnal Moat, PA-C

## 2019-01-25 ENCOUNTER — Other Ambulatory Visit: Payer: Self-pay

## 2019-01-25 DIAGNOSIS — Z20822 Contact with and (suspected) exposure to covid-19: Secondary | ICD-10-CM

## 2019-01-28 LAB — NOVEL CORONAVIRUS, NAA: SARS-CoV-2, NAA: NOT DETECTED

## 2019-04-08 ENCOUNTER — Ambulatory Visit (INDEPENDENT_AMBULATORY_CARE_PROVIDER_SITE_OTHER): Payer: BC Managed Care – PPO | Admitting: Physician Assistant

## 2019-04-08 ENCOUNTER — Encounter: Payer: Self-pay | Admitting: Physician Assistant

## 2019-04-08 ENCOUNTER — Other Ambulatory Visit: Payer: Self-pay

## 2019-04-08 DIAGNOSIS — F4323 Adjustment disorder with mixed anxiety and depressed mood: Secondary | ICD-10-CM | POA: Diagnosis not present

## 2019-04-08 DIAGNOSIS — F429 Obsessive-compulsive disorder, unspecified: Secondary | ICD-10-CM

## 2019-04-08 MED ORDER — FLUOXETINE HCL 40 MG PO CAPS: 80.0000 mg | ORAL_CAPSULE | Freq: Every day | ORAL | 1 refills | Status: DC

## 2019-04-08 MED ORDER — BUPROPION HCL ER (XL) 150 MG PO TB24: 450.0000 mg | ORAL_TABLET | Freq: Every day | ORAL | 1 refills | Status: DC

## 2019-04-08 NOTE — Progress Notes (Signed)
[facility]  Patient ID: Jessica Edwards,  MRN: [id number]  PCP: Juluis Rainier, MD  Date of Evaluation: 04/08/2019 Time spent:20 minutes  Chief Complaint:  Chief Complaint    Anxiety; Depression; Follow-up      HISTORY/CURRENT STATUS: HPI For routine med check.  Has been a little down. "I've just got a lot going on."  It doesn't affect her job or anything.  Still active in church and on prayer team. Covid, election results, a guy she's been interested in, work, change in Education officer, environmental at church, all have been overwhelming. Energy and motivation have been low sometimes.  Sleeps okay. Not crying easily. No SI/HI.  Patient denies increased energy with decreased need for sleep, no increased talkativeness, no racing thoughts, no impulsivity or risky behaviors, no increased spending, no increased libido, no grandiosity.  Denies dizziness, syncope, seizures, numbness, tingling, tremor, tics, unsteady gait, slurred speech, confusion. Denies muscle or joint pain, stiffness, or dystonia.  Individual Medical History/ Review of Systems: Changes? :No    Past medications for mental health diagnoses include: Cymbalta, Xanax  Allergies: Augmentin [amoxicillin-pot clavulanate], Codeine, and Doxycycline  Current Medications:  Current Outpatient Medications:  .  buPROPion (WELLBUTRIN XL) 150 MG 24 hr tablet, TAKE 3 TABLETS BY MOUTH EVERY DAY, Disp: 270 tablet, Rfl: 0 .  Clindamycin Phosphate foam, clindamycin 1 % topical foam, Disp: , Rfl:  .  clobetasol ointment (TEMOVATE) 0.05 %, clobetasol 0.05 % topical ointment, Disp: , Rfl:  .  Dapsone (ACZONE) 5 % topical gel, Aczone 5 % topical gel, Disp: , Rfl:  .  FLUoxetine (PROZAC) 40 MG capsule, TAKE 2 CAPSULES BY MOUTH DAILY, Disp: 180 capsule, Rfl: 1 .  spironolactone (ALDACTONE) 25 MG tablet, Take 25 mg by mouth daily., Disp: , Rfl:  .  tretinoin (RETIN-A) 0.025 % cream, tretinoin 0.025 % topical cream, Disp: , Rfl:  .  cetirizine  (ZYRTEC) 10 MG tablet, Take 10 mg by mouth daily., Disp: , Rfl:  Medication Side Effects: none  Family Medical/ Social History: Changes? No  MENTAL HEALTH EXAM:  There were no vitals taken for this visit.There is no height or weight on file to calculate BMI.  General Appearance: Casual, Neat, Well Groomed and Obese  Eye Contact:  Good  Speech:  Clear and Coherent  Volume:  Normal  Mood:  Euthymic  Affect:  Appropriate  Thought Process:  Goal Directed and Descriptions of Associations: Intact  Orientation:  Full (Time, Place, and Person)  Thought Content: Logical   Suicidal Thoughts:  No  Homicidal Thoughts:  No  Memory:  WNL  Judgement:  Good  Insight:  Good  Psychomotor Activity:  Normal  Concentration:  Concentration: Good  Recall:  Good  Fund of Knowledge: Good  Language: Good  Assets:  Desire for Improvement  ADL's:  Intact  Cognition: WNL  Prognosis:  Good    DIAGNOSES:    ICD-10-CM   1. Obsessive-compulsive disorder, unspecified type  F42.9   2. Adjustment disorder with mixed anxiety and depressed mood  F43.23     Receiving Psychotherapy: No    RECOMMENDATIONS:  I spent 20 minutes with her. Continue Wellbutrin XL 150 mg, 3 qd. Continue Prozac 40 mg 2 qd. Recommend counseling.  Return in 3 months.  Melony Overly, PA-C

## 2019-06-03 ENCOUNTER — Other Ambulatory Visit: Payer: Self-pay

## 2019-06-03 ENCOUNTER — Ambulatory Visit (INDEPENDENT_AMBULATORY_CARE_PROVIDER_SITE_OTHER): Payer: BC Managed Care – PPO | Admitting: Psychiatry

## 2019-06-03 DIAGNOSIS — F321 Major depressive disorder, single episode, moderate: Secondary | ICD-10-CM | POA: Diagnosis not present

## 2019-06-03 NOTE — Progress Notes (Signed)
[facility] Initial Adult Exam  Name: Jessica Edwards Date: 06/03/2019 MRN: [id number] DOB: 1970/06/19 PCP: Leighton Ruff, MD  Time spent: 60 minutes  4:00pm to 5:00[m   Guardian/Payee:  patient   Paperwork requested:  No   Reason for Visit /Presenting Problem/Symptoms:  Anxiety, depression, mother has [facility] at [facility] and dad lives there also. Has gained about 10 lbs since last year. Job is demanding and intense with [employer].  Seems to have lost my energy.    Mental Status Exam:   Appearance:   Well Groomed     Behavior:  Appropriate, Sharing and Motivated  Motor:  Normal  Speech/Language:   Normal Rate  Affect:  Depressed and anxious  Mood:  depressed  Thought process:  goal directed  Thought content:    WNL  Sensory/Perceptual disturbances:    WNL  Orientation:  oriented to person, place, time/date, situation, day of week, month of year and year  Attention:  Good  Concentration:  Good  Memory:  WNL  Fund of knowledge:   Good  Insight:    Good  Judgment:   Good  Impulse Control:  Good   Reported Symptoms:  See above.  Risk Assessment: Danger to Self:  No Self-injurious Behavior: No Danger to Others: No Duty to Warn:no Physical Aggression / Violence:No  Access to Firearms a concern: No  Gang Involvement:No  Patient / guardian was educated about steps to take if suicide or homicide risk level increases between visits: Denies any SI/HI. While future psychiatric events cannot be accurately predicted, the patient does not currently require acute inpatient psychiatric care and does not currently meet [facility] involuntary commitment criteria.  Substance Abuse History: Current substance abuse: No     Past Psychiatric History:   Previous psychological history is significant for stress and anxiety Outpatient Providers:Jane Lessard History of Psych Hospitalization: No  Psychological Testing: none   Abuse History: Victim  of No., no   Report needed: No. Victim of Neglect:No. Perpetrator of no  Witness / Exposure to Domestic Violence: No   Protective Services Involvement: No  Witness to [employer] Violence:  No   Family History: No family history on file.  Living situation: the patient lives alone  Sexual Orientation:  Straight  Relationship Status: single  Name of spouse / other: n/a             If a parent, number of children / ages:  No kids [place]; friends  Financial Stress:  No   Income/Employment/Disability: Employment  Armed forces logistics/support/administrative officer: No   Educational History: Education: Scientist, product/process development:   [employer]  Any cultural differences that may affect / interfere with treatment:  not applicable   Recreation/Hobbies: gardening , reading , painting  Stressors:Health problems Other: aging parents  Strengths:  Friends, Social worker, Spirituality, Hopefulness and Self Advocate  Barriers:  herself  Legal History: Pending legal issue / charges: The patient has no significant history of legal issues. History of legal issue / charges: none  Medical History/Surgical History:  Reviewed with patient and she confirms info below. Past Medical History:  Diagnosis Date  . Kidney stones   . Renal disorder     No past surgical history on file.--"surgery for kidney stones twice" per paitent  Medications:  Reviewd with patient and she confirms info below. Current Outpatient Medications  Medication Sig Dispense Refill  . buPROPion (WELLBUTRIN XL) 150 MG 24 hr tablet Take 3 tablets (450 mg total) by mouth daily.  270 tablet 1  . cetirizine (ZYRTEC) 10 MG tablet Take 10 mg by mouth daily.    . Clindamycin Phosphate foam clindamycin 1 % topical foam    . clobetasol ointment (TEMOVATE) 0.05 % clobetasol 0.05 % topical ointment    . Dapsone (ACZONE) 5 % topical gel Aczone 5 % topical gel    . FLUoxetine (PROZAC) 40 MG capsule Take 2 capsules (80 mg total) by mouth  daily. 180 capsule 1  . spironolactone (ALDACTONE) 25 MG tablet Take 25 mg by mouth daily.    Marland Kitchen tretinoin (RETIN-A) 0.025 % cream tretinoin 0.025 % topical cream     No current facility-administered medications for this visit.    Allergies  Allergen Reactions  . Augmentin [Amoxicillin-Pot Clavulanate] Nausea And Vomiting  . Codeine Nausea And Vomiting  . Doxycycline Nausea And Vomiting    Diagnoses:    ICD-10-CM   1. Moderate major depression, single episode (HCC)  F32.1     Plan of Care:  Patient not signing treatment plan on computer screen due to Covid.  Treatment Goals: Goals remain on treatment plan as patient works with strategies to achieve their goals.  Progress is noted each session in the "Progress" section of Plan.  Long Term Goal: (meds per Melony Overly, PA-C, Prozac and Wellbutrin) Develop healthy cognitive patterns and beliefs about self and the world that lead to alleviation and help prevent relapse of depression.  Patient reports wanting to be more productive, get up earlier, have goals to get things done at home and work, with emphasis on positive self-talk.   Short Term Goal: Identify and replace depressive thinking that leads to depressive feelings and actions.  Strategies: 1. Replace negative self-defeating self-talk with verbalization of realistic and positive cognitive messages. 2. Reinforce positive, realithy-based cognitive messages that enhance self confidence and increase adaptive action.   PROGRESS: Today is patient's first session and gathering all the information above, we worked collaboratively on her treatment gpal plan. She wants to be less depressed,,happier, and more productive. 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 Is to make list of anxious and depressive thoughts and bring in next visit for  follow up.    Next appt within 2 weeks.  Mathis Fare, LCSW

## 2019-06-17 ENCOUNTER — Other Ambulatory Visit: Payer: Self-pay | Admitting: Orthopedic Surgery

## 2019-06-17 DIAGNOSIS — M545 Low back pain, unspecified: Secondary | ICD-10-CM

## 2019-06-17 DIAGNOSIS — M5416 Radiculopathy, lumbar region: Secondary | ICD-10-CM

## 2019-07-08 ENCOUNTER — Other Ambulatory Visit: Payer: Self-pay

## 2019-07-08 ENCOUNTER — Encounter: Payer: Self-pay | Admitting: Physician Assistant

## 2019-07-08 ENCOUNTER — Ambulatory Visit (INDEPENDENT_AMBULATORY_CARE_PROVIDER_SITE_OTHER): Payer: BC Managed Care – PPO | Admitting: Physician Assistant

## 2019-07-08 DIAGNOSIS — F429 Obsessive-compulsive disorder, unspecified: Secondary | ICD-10-CM

## 2019-07-08 DIAGNOSIS — F411 Generalized anxiety disorder: Secondary | ICD-10-CM | POA: Diagnosis not present

## 2019-07-08 MED ORDER — SERTRALINE HCL 100 MG PO TABS: 100.0000 mg | ORAL_TABLET | Freq: Every day | ORAL | 1 refills | Status: DC

## 2019-07-08 NOTE — Progress Notes (Signed)
[facility] Check  Patient ID: Jessica Edwards,  MRN: [id number]  PCP: Jessica Ruff, MD  Date of Evaluation: 07/08/2019 Time spent:20 minutes  Chief Complaint:  Chief Complaint    Anxiety; Depression      HISTORY/CURRENT STATUS: HPI For routine med check.  She has been keeping a journal as recommended by Jessica Cloud, LCSW, writing down when she's depressed. States she's never depressed, but worries all the time.  About everything. Constantly thinks about what other people think or not wanting to hurt somebody else's feelings.   Is more anxious and the Prozac isn't working as well anymore. OCD is still a problem.  She took Zoloft in the past which did seem to help better.  She does not really have compulsions but more possessions.  Has ruminating thoughts and always wants things to be perfect.  Her housekeeping is not one of her priorities though.  Patient denies loss of interest in usual activities and is able to enjoy things.  Denies decreased energy or motivation.  Appetite has not changed. Denies any changes in concentration, making decisions or remembering things.  Denies suicidal or homicidal thoughts.  Patient denies increased energy with decreased need for sleep, no increased talkativeness, no racing thoughts, no impulsivity or risky behaviors, no increased spending, no increased libido, no grandiosity.  Denies dizziness, syncope, seizures, numbness, tingling, tremor, tics, unsteady gait, slurred speech, confusion. Denies muscle or joint pain, stiffness, or dystonia.  Individual Medical History/ Review of Systems: Changes? :No    Past medications for mental health diagnoses include: Cymbalta, Xanax, Zoloft  Allergies: Augmentin [amoxicillin-pot clavulanate], Codeine, and Doxycycline  Current Medications:  Current Outpatient Medications:  .  buPROPion (WELLBUTRIN XL) 150 MG 24 hr tablet, Take 3 tablets (450 mg total) by mouth daily., Disp: 270 tablet, Rfl: 1 .   cetirizine (ZYRTEC) 10 MG tablet, Take 10 mg by mouth daily., Disp: , Rfl:  .  Clindamycin Phosphate foam, clindamycin 1 % topical foam, Disp: , Rfl:  .  clobetasol ointment (TEMOVATE) 0.05 %, clobetasol 0.05 % topical ointment, Disp: , Rfl:  .  Dapsone (ACZONE) 5 % topical gel, Aczone 5 % topical gel, Disp: , Rfl:  .  spironolactone (ALDACTONE) 25 MG tablet, Take 25 mg by mouth daily., Disp: , Rfl:  .  tretinoin (RETIN-A) 0.025 % cream, tretinoin 0.025 % topical cream, Disp: , Rfl:  .  sertraline (ZOLOFT) 100 MG tablet, Take 1 tablet (100 mg total) by mouth daily., Disp: 30 tablet, Rfl: 1 Medication Side Effects: none  Family Medical/ Social History: Changes? No  MENTAL HEALTH EXAM:  There were no vitals taken for this visit.There is no height or weight on file to calculate BMI.  General Appearance: Casual, Neat, Well Groomed and Obese  Eye Contact:  Good  Speech:  Clear and Coherent  Volume:  Normal  Mood:  Euthymic  Affect:  Appropriate  Thought Process:  Goal Directed and Descriptions of Associations: Intact  Orientation:  Full (Time, Place, and Person)  Thought Content: Logical   Suicidal Thoughts:  No  Homicidal Thoughts:  No  Memory:  WNL  Judgement:  Good  Insight:  Good  Psychomotor Activity:  Normal  Concentration:  Concentration: Good  Recall:  Good  Fund of Knowledge: Good  Language: Good  Assets:  Desire for Improvement  ADL's:  Intact  Cognition: WNL  Prognosis:  Good    DIAGNOSES:    ICD-10-CM   1. Obsessive-compulsive disorder, unspecified type  F42.9   2.  GAD (generalized anxiety disorder)  F41.1     Receiving Psychotherapy: Yes Jessica Menghini, LCSW   RECOMMENDATIONS:  PDMP reviewed.  I spent 20 minutes with her. Continue Wellbutrin XL 150 mg, 3 qd. Stop Prozac now. Start Zoloft 100 mg, 1 p.o. daily, on 07/15/2019. Continue therapy with Jessica Menghini, LCSW. Return in 6 weeks.   Jessica Overly, PA-C

## 2019-07-08 NOTE — Patient Instructions (Signed)
Stop Prozac. Start the Zoloft on 07/15/2019

## 2019-07-09 ENCOUNTER — Ambulatory Visit (INDEPENDENT_AMBULATORY_CARE_PROVIDER_SITE_OTHER): Payer: BC Managed Care – PPO | Admitting: Psychiatry

## 2019-07-09 DIAGNOSIS — F429 Obsessive-compulsive disorder, unspecified: Secondary | ICD-10-CM

## 2019-07-09 NOTE — Progress Notes (Signed)
      [facility] Counselor/Therapist Progress Note  Patient ID: Jessica Edwards, MRN: [id number],    Date: 07/09/2019  Time Spent: 60 minutes  3:00pm to 4:00pm  Treatment Type: Individual Therapy  Reported Symptoms:  Obsessive-compulsive, anxiety, depression  Mental Status Exam:  Appearance:   Neat     Behavior:  obsessive-computsive, sharing  Motor:  Normal  Speech/Language:   Normal Rate  Affect:  anxious  Mood:  anxious  Thought process:  goal directed  Thought content:    Obsessions  Sensory/Perceptual disturbances:    WNL  Orientation:  oriented to person, place, time/date, situation, day of week, month of year and year  Attention:  Good  Concentration:  Good  Memory:  WNL  Fund of knowledge:   Good  Insight:    Good and Fair  Judgment:   Good  Impulse Control:  Good   Risk Assessment: Danger to Self:  No Self-injurious Behavior: No Danger to Others: No Duty to Warn:no Physical Aggression / Violence:No  Access to Firearms a concern: No  Gang Involvement:No   Subjective: Patient in today with anxiety, obsessive-compulsive, and some depression. Worries excessively. Shared her homework from initial session.  "I try to be perfect."   Interventions: Cognitive Behavioral Therapy and Solution-Oriented/Positive Psychology  Diagnosis:   ICD-10-CM   1. Obsessive-compulsive disorder, unspecified type  F42.9     Plan of Care:  Patient not signing treatment plan on computer screen due to Covid.  Treatment Goals: Goals remain on treatment plan as patient works with strategies to achieve their goals.  Progress is noted each session in the "Progress" section of Plan.  Long Term Goal: (meds per Melony Overly, PA-C, Prozac and Wellbutrin) Develop healthy cognitive patterns and beliefs about self and the world that lead to alleviation and help prevent relapse of depression.  Patient reports wanting to be more productive, get up earlier, have goals to get things done at  home and work, with emphasis on positive self-talk.   Short Term Goal: Identify and replace depressive thinking that leads to depressive feelings and actions.  Strategies: 1. Replace negative self-defeating self-talk with verbalization of realistic and positive cognitive messages. 2. Reinforce positive, realithy-based cognitive messages that enhance self confidence and increase adaptive action.   PROGRESS: Patient in today reporting anxiety, obsessive-compulsiveness, and some depression. In processing her homework from first session, worry and obsessions were quite prominent. Some circular thinking, then catches herself. Worked in session today with her anxiety and "worrying so much about what I say." Tends to personalize everything.  Worries excessively about what I say and what others think about me".  Focused on being able to stop some of her most frequent obsessive thought patterns and work to let them go versus keep thinking about them and trying to "figure it all out."  Gets depressed when she has so much difficulty with stopping her obsessive-compulsiveness. To use strategies discussed today and add in positive self-talk. Will make limited notes and bring in next session.  Seems motivated. Using strategy above, reinforcing positive, reality-based cognitive messages that enhance self confidence and increase adaptive actions.   Goal review and progress noted.  Next appt within 2 weeks.     Mathis Fare, LCSW

## 2019-07-13 ENCOUNTER — Ambulatory Visit
Admission: RE | Admit: 2019-07-13 | Discharge: 2019-07-13 | Disposition: A | Discharge status: Home / Self Care | Payer: BC Managed Care – PPO | Source: Ambulatory Visit | Attending: Orthopedic Surgery | Admitting: Orthopedic Surgery

## 2019-07-13 DIAGNOSIS — M545 Low back pain, unspecified: Secondary | ICD-10-CM

## 2019-07-13 DIAGNOSIS — M5416 Radiculopathy, lumbar region: Secondary | ICD-10-CM

## 2019-07-17 ENCOUNTER — Other Ambulatory Visit: Payer: BC Managed Care – PPO

## 2019-07-23 ENCOUNTER — Ambulatory Visit: Payer: BC Managed Care – PPO | Admitting: Psychiatry

## 2019-07-23 ENCOUNTER — Other Ambulatory Visit: Payer: Self-pay

## 2019-07-23 DIAGNOSIS — F411 Generalized anxiety disorder: Secondary | ICD-10-CM

## 2019-07-23 NOTE — Progress Notes (Signed)
[facility] Counselor/Therapist Progress Note  Patient ID: Jessica Edwards, MRN: [id number],    Date: 07/23/2019  Time Spent: 60 minutes  5:00pm to 6:00pm  Treatment Type: Individual Therapy  Reported Symptoms: anxiety, some obsessiveness, worrying  Mental Status Exam:  Appearance:   Neat     Behavior:  Appropriate, Sharing and Motivated  Motor:  Normal  Speech/Language:   Clear and Coherent  Affect:  anxious  Mood:  anxious  Thought process:  normal  Thought content:    WNL (some obsessiveness)  Sensory/Perceptual disturbances:    WNL  Orientation:  oriented to person, place, time/date, situation, day of week, month of year and year  Attention:  Good  Concentration:  Good  Memory:  WNL  Fund of knowledge:   Good  Insight:    Good  Judgment:   Good  Impulse Control:  Good   Risk Assessment: Danger to Self:  No Self-injurious Behavior: No Danger to Others: No Duty to Warn:no Physical Aggression / Violence:No  Access to Firearms a concern: No  Gang Involvement:No   Subjective:    Interventions: Cognitive Behavioral Therapy and Solution-Oriented/Positive Psychology  Diagnosis:   ICD-10-CM   1. GAD (generalized anxiety disorder)  F41.1      Plan of Care: Patient not signing treatment plan on computer screen due to Covid.  Treatment Goals: Goals remain on treatment plan as patient works with strategies to achieve their goals. Progress is noted each session in the "Progress" section of Plan.  Long Term Goal: (meds per Melony Overly, PA-C, Prozac and Wellbutrin) Develop healthy cognitive patterns and beliefs about self and the world that lead to alleviation and help prevent relapse of depression. Patient reports wanting to be more productive, get up earlier, have goals to get things done at home and work, with emphasis on positive self-talk.  Short Term Goal: Identify and replace depressive thinking that leads to depressive feelings and  actions.  Strategies: 1. Replace negative self-defeating self-talk with verbalization of realistic and positive cognitive messages. 2. Reinforce positive, realithy-based cognitive messages that enhance self confidence and increase adaptive action.   PROGRESS: Patient in today reporting anxiety, obsessiveness, worrying , worries about offending people, overthinking and overanalyzing. Patient states "I think I'm some better".  I feel like I control more of what I say and used to, I didn't always feel that I controlled what I would say.  Feel "less pressured" and "less emotionally down" and have actually begun to "build in some exercise." I need to make decisions without obsessing over them so much.. Working not to personalize as much and realize things can happen with people that have nothing to do with her. 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 Patient realizing that she has difficulty "letting go" of thoughts/regrets. Worked also some today on "staying on track in conversations" staying in the present and not jumping ahead. Lessen the "what-ifs".  She tends to spread herself too thin in working on multiple strategies and multiple concerns and ends up feeling more "shredded" and less effective in her trying to make changes.  Agreed that her focus between now and next session will be more targeted on "Letting Go" and Self-Awareness in interacting and speaking with others.  Encouraged good self care to include positive self-talk, keep up the exercise she has started.   Goal review and progress noted with patient.  Next appt within 2-3 weeks.   Mathis Fare, LCSW

## 2019-08-06 ENCOUNTER — Other Ambulatory Visit: Payer: Self-pay | Admitting: Physician Assistant

## 2019-08-07 ENCOUNTER — Other Ambulatory Visit: Payer: Self-pay

## 2019-08-07 ENCOUNTER — Ambulatory Visit: Payer: BC Managed Care – PPO | Admitting: Psychiatry

## 2019-08-07 DIAGNOSIS — F411 Generalized anxiety disorder: Secondary | ICD-10-CM | POA: Diagnosis not present

## 2019-08-07 NOTE — Progress Notes (Signed)
      [facility] Counselor/Therapist Progress Note  Patient ID: Jessica Edwards, MRN: [id number],    Date: 08/07/2019  Time Spent: 60 minutes  5:00pm to 6:00pm  Treatment Type: Individual Therapy  Reported Symptoms: anxiety, some obsessiveness  Mental Status Exam:  Appearance:   Neat     Behavior:  Appropriate, Sharing and Motivated  Motor:  Normal  Speech/Language:   Normal Rate  Affect:  anxious   Mood:  anxious  Thought process:  goal directed and some obsessiveness  Thought content:    some obsessivenes  Sensory/Perceptual disturbances:    WNL  Orientation:  oriented to person, place, time/date, situation, day of week, month of year and year  Attention:  Good/Fair  Concentration:  Good and Fair  Memory:  some forgetfulness but not excessive; ocassional "word retrieval " issues  Fund of knowledge:   Good  Insight:    Good  Judgment:   Good and Fair  Impulse Control:  Good and Fair   Risk Assessment: Danger to Self:  No Self-injurious Behavior: No Danger to Others: No Duty to Warn:no Physical Aggression / Violence:No  Access to Firearms a concern: No  Gang Involvement:No   Subjective: Patient today reports primarily anxiety as her main symptom.  Also some obsessiveness and overthinking.  Interventions: Cognitive Behavioral Therapy and Solution-Oriented/Positive Psychology  Diagnosis:   ICD-10-CM   1. GAD (generalized anxiety disorder)  F41.1      Plan of Care: Patient not signing treatment plan on computer screen due to Covid.  Treatment Goals: Goals remain on treatment plan as patient works with strategies to achieve their goals. Progress is noted each session in the "Progress" section of Plan.  Long Term Goal: (meds per Melony Overly, PA-C, Prozac and Wellbutrin) Develop healthy cognitive patterns and beliefs about self and the world that lead to alleviation and help prevent relapse of depression. Patient reports wanting to be more productive, get up  earlier, have goals to get things done at home and work, with emphasis on positive self-talk.  Short Term Goal: Identify and replace anxious/depressie thinking that leads to anxious/depressive feelings and actions.  Strategies: 1. Replace negative self-defeating self-talk with verbalization of realistic and positive cognitive messages. 2. Reinforce positive, realithy-based cognitive messages that enhance self confidence and increase adaptive action.   PROGRESS: Patient  in today with continued anxiety, some obsessiveness, and worries about her parents in long-term care and about herself and her job. My anxiety "is like 24/7".  Today presents with heightened anxiety and did a good job in talking through stressors and also how she can be so unaware of her anxiety and then becomes quite tired," later realizing how anxious she's been and how it tires her down."   Acknowledges that in order to change her anxiety, she must become more aware of it as it's happening and begin to interrupt it, and work with some strategies discussed today to re-focus her behavior and replace anxious thoughts with more positive and empowering thoughts and self-talk.  To work on making her self-talk more accepting and encouraging. Be more self-aware but not critical---address changes needed in positive not punitive ways. Will review next session.  Goal review and progress noted with patient.  Next appt  within 2 weeks.   Mathis Fare, LCSW

## 2019-08-19 ENCOUNTER — Other Ambulatory Visit: Payer: Self-pay

## 2019-08-19 ENCOUNTER — Ambulatory Visit (INDEPENDENT_AMBULATORY_CARE_PROVIDER_SITE_OTHER): Payer: BC Managed Care – PPO | Admitting: Physician Assistant

## 2019-08-19 ENCOUNTER — Encounter: Payer: Self-pay | Admitting: Physician Assistant

## 2019-08-19 DIAGNOSIS — F411 Generalized anxiety disorder: Secondary | ICD-10-CM

## 2019-08-19 DIAGNOSIS — F429 Obsessive-compulsive disorder, unspecified: Secondary | ICD-10-CM

## 2019-08-19 DIAGNOSIS — F3341 Major depressive disorder, recurrent, in partial remission: Secondary | ICD-10-CM

## 2019-08-19 NOTE — Progress Notes (Signed)
[facility]  Patient ID: Jessica Edwards,  MRN: [id number]  PCP: Juluis Rainier, MD  Date of Evaluation: 08/19/2019 Time spent:20 minutes  Chief Complaint:  Chief Complaint    Anxiety; Depression      HISTORY/CURRENT STATUS: For routine med check.  OCD is better after starting the Zoloft. Feels more relaxed and she's responded better to the Zoloft than she did with Prozac. Talking with Rockne Menghini, LCSW and that's been helpful too.  Work is going well.  She will be working over the summer for 5 weeks.  Patient denies loss of interest in usual activities and is able to enjoy things.  Denies decreased energy or motivation.  Appetite has not changed. Denies any changes in concentration, making decisions or remembering things.  Denies suicidal or homicidal thoughts.  Patient denies increased energy with decreased need for sleep, no increased talkativeness, no racing thoughts, no impulsivity or risky behaviors, no increased spending, no increased libido, no grandiosity.  Denies dizziness, syncope, seizures, numbness, tingling, tremor, tics, unsteady gait, slurred speech, confusion. Denies muscle or joint pain, stiffness, or dystonia.  Individual Medical History/ Review of Systems: Changes? :No    Past medications for mental health diagnoses include: Cymbalta, Xanax, Zoloft  Allergies: Augmentin [amoxicillin-pot clavulanate], Codeine, and Doxycycline  Current Medications:  Current Outpatient Medications:  .  buPROPion (WELLBUTRIN XL) 150 MG 24 hr tablet, Take 3 tablets (450 mg total) by mouth daily., Disp: 270 tablet, Rfl: 1 .  cetirizine (ZYRTEC) 10 MG tablet, Take 10 mg by mouth daily., Disp: , Rfl:  .  Clindamycin Phosphate foam, clindamycin 1 % topical foam, Disp: , Rfl:  .  clobetasol ointment (TEMOVATE) 0.05 %, clobetasol 0.05 % topical ointment, Disp: , Rfl:  .  Dapsone (ACZONE) 5 % topical gel, Aczone 5 % topical gel, Disp: , Rfl:  .  sertraline (ZOLOFT) 100  MG tablet, TAKE 1 TABLET BY MOUTH EVERY DAY, Disp: 90 tablet, Rfl: 0 .  spironolactone (ALDACTONE) 25 MG tablet, Take 25 mg by mouth daily., Disp: , Rfl:  .  tretinoin (RETIN-A) 0.025 % cream, tretinoin 0.025 % topical cream, Disp: , Rfl:  Medication Side Effects: none  Family Medical/ Social History: Changes? No  MENTAL HEALTH EXAM:  There were no vitals taken for this visit.There is no height or weight on file to calculate BMI.  General Appearance: Casual, Neat, Well Groomed and Obese  Eye Contact:  Good  Speech:  Clear and Coherent  Volume:  Normal  Mood:  Euthymic  Affect:  Appropriate  Thought Process:  Goal Directed and Descriptions of Associations: Intact  Orientation:  Full (Time, Place, and Person)  Thought Content: Logical   Suicidal Thoughts:  No  Homicidal Thoughts:  No  Memory:  WNL  Judgement:  Good  Insight:  Good  Psychomotor Activity:  Normal  Concentration:  Concentration: Good  Recall:  Good  Fund of Knowledge: Good  Language: Good  Assets:  Desire for Improvement  ADL's:  Intact  Cognition: WNL  Prognosis:  Good    DIAGNOSES:    ICD-10-CM   1. Obsessive-compulsive disorder, unspecified type  F42.9   2. GAD (generalized anxiety disorder)  F41.1   3. Recurrent major depressive disorder, in partial remission (HCC)  F33.41     Receiving Psychotherapy: Yes Rockne Menghini, LCSW   RECOMMENDATIONS:  PDMP reviewed.  Continue Wellbutrin XL 150 mg, 3 qd.  Continue Zoloft 100 mg, 1 p.o. daily. Continue therapy with Rockne Menghini, LCSW. Return in 3 months.  Donnal Moat, PA-C

## 2019-08-21 ENCOUNTER — Ambulatory Visit (INDEPENDENT_AMBULATORY_CARE_PROVIDER_SITE_OTHER): Payer: BC Managed Care – PPO | Admitting: Psychiatry

## 2019-08-21 ENCOUNTER — Other Ambulatory Visit: Payer: Self-pay

## 2019-08-21 DIAGNOSIS — F411 Generalized anxiety disorder: Secondary | ICD-10-CM | POA: Diagnosis not present
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
## 2019-08-21 NOTE — Progress Notes (Signed)
      [facility] Counselor/Therapist Progress Note  Patient ID: Jessica Edwards, MRN: [id number],    Date: 08/21/2019  Time Spent: 60 minutes   4:00pm to 5:00pm  Treatment Type: Individual Therapy  Reported Symptoms: anxiety, some obsessive thoughts, some depression re: again parents and health concerns Mental Status Exam:  Appearance:   Neat     Behavior:  Appropriate, Sharing and Motivated  Motor:  Normal  Speech/Language:   Normal Rate  Affect:  anxious  Mood:  anxious  Thought process:  normal  Thought content:    some obsessiveness  Sensory/Perceptual disturbances:    WNL  Orientation:  oriented to person, place, time/date, situation, day of week, month of year and year  Attention:  Good  Concentration:  Good  Memory:  WNL  Fund of knowledge:   Good  Insight:    Good  Judgment:   Good  Impulse Control:  Good   Risk Assessment: Danger to Self:  No Self-injurious Behavior: No Danger to Others: No Duty to Warn:no Physical Aggression / Violence:No  Access to Firearms a concern: No  Gang Involvement:No   Subjective: Patient in today reporting anxiety, some obsessiveness, and some depression re: aging parents and health issues.  Interventions: Cognitive Behavioral Therapy, Solution-Oriented/Positive Psychology and Ego-Supportive  Diagnosis:   ICD-10-CM   1. GAD (generalized anxiety disorder)  F41.1      Plan of Care: Patient not signing treatment plan on computer screen due to Covid.  Treatment Goals: Goals remain on treatment plan as patient works with strategies to achieve their goals. Progress is noted each session in the "Progress" section of Plan.  Long Term Goal: (meds per Melony Overly, PA-C, Prozac and Wellbutrin) Develop healthy cognitive patterns and beliefs about self and the world that lead to alleviation and help prevent relapse of depression. Patient reports wanting to be more productive, get up earlier, have goals to get things done at home  and work, with emphasis on positive self-talk.  Short Term Goal: Identify and replace anxious/depressie thinking that leads to anxious/depressive feelings and actions.  Strategies: 1. Replace negative self-defeating self-talk with verbalization of realistic and positive cognitive messages. 2. Reinforce positive, realithy-based cognitive messages that enhance self confidence and increase adaptive action.   PROGRESS: Patient in today with anxiety, obsessiveness, and some depression re: elderly parents and health concerns. Patient reports she is interrupting some of her obsessiveness, stopping it, and able to move forward. "Not hanging onto my obsessive thoughts as much."  Also questions herself as to the validity of her obsessive thoughts in order to help let go of them. Did try some letting go of worries re: her dad's situation and she feels she did well on that.  In conversation, it is obvious that patient still has some obsessiveness.  She feels the Zoloft is helping with her anxiety. Working on improving her self-talk, "especially being more positive with herself in the moment." Using the strategies above, she is typically a "worst case scenario person" and is working to be more positive and interrupt her negative thoughts and self-talk and replace with healthier, reality-based thoughts and self-talk. Needing to work more on affecting her obsessiveness. Focus more on making changes in positive not punitive ways.     Goal review and progress noted with patient.  Next appt within 2 weeks.   Mathis Fare, LCSW

## 2019-09-04 ENCOUNTER — Ambulatory Visit (INDEPENDENT_AMBULATORY_CARE_PROVIDER_SITE_OTHER): Payer: BC Managed Care – PPO | Admitting: Psychiatry

## 2019-09-04 ENCOUNTER — Other Ambulatory Visit: Payer: Self-pay

## 2019-09-04 DIAGNOSIS — F411 Generalized anxiety disorder: Secondary | ICD-10-CM

## 2019-09-04 NOTE — Progress Notes (Signed)
      [facility] Counselor/Therapist Progress Note  Patient ID: Jessica Edwards, MRN: [id number],    Date: 09/04/2019  Time Spent: 60 minutes   4:00pm to 5:00pm  Treatment Type: Individual Therapy  Reported Symptoms: anxiety, "worries" mostly about aging parents, overthinking   Mental Status Exam:  Appearance:   Well Groomed     Behavior:  Appropriate, Sharing and Motivated  Motor:  Normal  Speech/Language:   Clear and Coherent  Affect:  anxious  Mood:  anxious  Thought process:  goal directed  Thought content:    difficult to be "to the point" in conversation; overthinks  Sensory/Perceptual disturbances:    WNL  Orientation:  oriented to person, place, time/date, situation, day of week, month of year and year  Attention:  Good  Concentration:  Good and Fair  Memory:  some forgetfulness  Fund of knowledge:   Good  Insight:    Good  Judgment:   Good  Impulse Control:  Good   Risk Assessment: Danger to Self:  No Self-injurious Behavior: No Danger to Others: No Duty to Warn:no Physical Aggression / Violence:No  Access to Firearms a concern: No  Gang Involvement:No   Subjective: Patient today report symptoms of anxiety, overthinking, and worries about her aging parents.  Interventions: Cognitive Behavioral Therapy and Solution-Oriented/Positive Psychology  Diagnosis:   ICD-10-CM   1. GAD (generalized anxiety disorder)  F41.1     Plan of Care: Patient not signing treatment plan on computer screen due to Covid.  Treatment Goals: Goals remain on treatment plan as patient works with strategies to achieve their goals. Progress is noted each session in the "Progress" section of Plan.  Long Term Goal: (meds per Melony Overly, PA-C, Prozac and Wellbutrin) Develop healthy cognitive patterns and beliefs about self and the world that lead to alleviation and help prevent relapse of depression. Patient reports wanting to be more productive, get up earlier, have goals to  get things done at home and work, with emphasis on positive self-talk.  Short Term Goal: Identify and replaceanxious/depressiethinking that leads to anxious/depressive feelings and actions.  Strategies: 1. Replace negative self-defeating self-talk with verbalization of realistic and positive cognitive messages. 2. Reinforce positive, realithy-based cognitive messages that enhance self confidence and increase adaptive action.   PROGRESS: Patient in today reporting anxiety, overthinking, and worrying excessively about her parents.  Discussed her parents' situation in long-term care and was able to process in more detail the concerns she is having about her parents. Seemed to feel some empowerment and more of a sense of hopefulness and confirmation she has helped make some good decisions for her parent's care long term. Obsessiveness is about the same and patient agrees but she is more aware of it and is working towards being less obsessive in her thoughts. To continue working on identifying more quickly her anxious thoughts, interrupt them and challenge them, and then replace them with more reality-based, positive and more empowering thoughts that do not support anxiety.  To also work to get her self-talk to be more consistently positive.  Goal review and progress noted with patient.  Next appt within 2 weeks.   Mathis Fare, LCSW

## 2019-09-18 ENCOUNTER — Ambulatory Visit: Payer: BC Managed Care – PPO | Admitting: Psychiatry

## 2019-10-02 ENCOUNTER — Other Ambulatory Visit: Payer: Self-pay

## 2019-10-02 ENCOUNTER — Ambulatory Visit: Payer: BC Managed Care – PPO | Admitting: Psychiatry

## 2019-10-02 DIAGNOSIS — F411 Generalized anxiety disorder: Secondary | ICD-10-CM

## 2019-10-02 NOTE — Progress Notes (Signed)
[facility] Counselor/Therapist Progress Note  Patient ID: Jessica Edwards, MRN: [id number],    Date: 10/02/2019  Time Spent: 50 minutes  5:10pm to 6:00pm  Treatment Type: Individual Therapy  Reported Symptoms:  Anxiety, stressed  Mental Status Exam:  Appearance:   Neat     Behavior:  Appropriate, Sharing and Motivated  Motor:  Normal  Speech/Language:   Normal Rate  Affect:  anxious  Mood:  anxious  Thought process:  normal  Thought content:    Obsessions, but is improving  Sensory/Perceptual disturbances:    WNL  Orientation:  oriented to person, place, time/date, situation, day of week, month of year and year  Attention:  Good  Concentration:  Good  Memory:  WNL  Fund of knowledge:   Good  Insight:    Good  Judgment:   Good  Impulse Control:  Good   Risk Assessment: Danger to Self:  No Self-injurious Behavior: No Danger to Others: No Duty to Warn:no Physical Aggression / Violence:No  Access to Firearms a concern: No  Gang Involvement:No   Subjective:  Patient today in with anxiety "but some improvement".    Interventions: Cognitive Behavioral Therapy and Solution-Oriented/Positive Psychology  Diagnosis:   ICD-10-CM   1. GAD (generalized anxiety disorder)  F41.1      Plan of Care: Patient not signing treatment plan on computer screen due to Covid.  Treatment Goals: Goals remain on treatment plan as patient works with strategies to achieve their goals. Progress is noted each session in the "Progress" section of Plan.  Long Term Goal: (meds per Melony Overly, PA-C, Prozac and Wellbutrin) Develop healthy cognitive patterns and beliefs about self and the world that lead to alleviation and help prevent relapse of depression. Patient reports wanting to be more productive, get up earlier, have goals to get things done at home and work, with emphasis on positive self-talk.  Short Term Goal: Identify and replaceanxious/depressiethinking that leads to  anxious/depressive feelings and actions.  Strategies: 1. Replace negative self-defeating self-talk with verbalization of realistic and positive cognitive messages. 2. Reinforce positive, realithy-based cognitive messages that enhance self confidence and increase adaptive action.   PROGRESS: Patient in today with anxiety "but it has improved some".  Had concern with a physical issue recently and went to Dr. Camie Patience some medication and female hormones and already feeling better, plus "I think it's improved my energy level. Has been working with strategies on her goals and symptom relief and reports that she has decreased her overthinking but it is difficult because I overthink so quickly but still working on it. Trying to decrease her excessive worrying (especially about her parents) and has had some success in interrupting her worrisome thoughts. Working on stopping the over-apologizing so much "as I do it all the time", and also "trying to stop so much negating of myself."  Sometimes bad about "blurting things out without thinking but I've put my filter back on and am working on this also." Obsessiveness continues although patient is catching herself more and states she is seeing the obsessions as happening less often. Shared examples of recent anxious thoughts and continues to work on identifying her anxious thoughts earlier, interrupting them and replace them with more positive, reality-based, and empowering thoughts and self-talk that do not support anxiety/negativity. Patient working hard on her goals above, with good motivation and attitude.  Goal review and progress noted with patient.  Next appt within 2-3 weeks.   Mathis Fare, LCSW

## 2019-10-18 ENCOUNTER — Other Ambulatory Visit: Payer: Self-pay | Admitting: Physician Assistant

## 2019-10-22 ENCOUNTER — Ambulatory Visit: Payer: BC Managed Care – PPO | Admitting: Psychiatry

## 2019-10-22 ENCOUNTER — Other Ambulatory Visit: Payer: Self-pay

## 2019-10-22 DIAGNOSIS — F429 Obsessive-compulsive disorder, unspecified: Secondary | ICD-10-CM

## 2019-10-22 NOTE — Progress Notes (Signed)
[facility] Counselor/Therapist Progress Note  Patient ID: Jessica Edwards, MRN: [id number],    Date: 10/22/2019  Time Spent: 60 minutes   4:55pm to 5:55pm    Treatment Type: Individual Therapy  Reported Symptoms: anxiety, obsessiveness "at times"  Mental Status Exam:  Appearance:   Neat     Behavior:  Appropriate, Sharing and Motivated  Motor:  Normal  Speech/Language:   Clear and Coherent  Affect:  anxiety  Mood:  anxious  Thought process:  goal directed  Thought content:    WNL and some obsessiveness but progressing  Sensory/Perceptual disturbances:    WNL  Orientation:  oriented to person, place, time/date, situation, day of week, month of year and year  Attention:  Good  Concentration:  Good and Fair  Memory:  WNL  Fund of knowledge:   Good  Insight:    Good  Judgment:   Good  Impulse Control:  Good   Risk Assessment: Danger to Self:  No Self-injurious Behavior: No Danger to Others: No Duty to Warn:no Physical Aggression / Violence:No  Access to Firearms a concern: No  Gang Involvement:No   Subjective: Patient today reports anxiety and obsessiveness ("am working on this and there is some improvement".)  Interventions: Cognitive Behavioral Therapy and Solution-Oriented/Positive Psychology  Diagnosis:   ICD-10-CM   1. Obsessive-compulsive disorder, unspecified type  F42.9     Plan of Care: Patient not signing treatment plan on computer screen due to Covid.  Treatment Goals: Goals remain on treatment plan as patient works with strategies to achieve their goals. Progress is noted each session in the "Progress" section of Plan.  Long Term Goal: (meds per Melony Overly, PA-C, Prozac and Wellbutrin) Develop healthy cognitive patterns and beliefs about self and the world that lead to alleviation and help prevent relapse of depression. Patient reports wanting to be more productive, get up earlier, have goals to get things done at home and work, with  emphasis on positive self-talk.  Short Term Goal: Identify and replaceanxious/depressiethinking that leads to anxious/depressive feelings and actions.  Strategies: 1. Replace negative self-defeating self-talk with verbalization of realistic and positive cognitive messages. 2. Reinforce positive, realithy-based cognitive messages that enhance self confidence and increase adaptive action.   PROGRESS: Patient in today reporting anxiety and some obsessiveness, states the obsessiveness is improving some but it seems to be she is recognizing it more. Is motivated and is catching herself more in midst of obsessing.  Her attitude is good and she does have a difficult time in interrupting the obsessive thoughts but is making more gains recently so feels encouraged.  Today we focused on the goals on her treatment plan and specifically her negative self-defeating self talk which tends to make it harder to interrupt her obsessive thoughts.  Worked on triggers that she has noticed with both obsessiveness and also some negativity and anxiety.  Also focused on interrupting the obsessiveness, during which she did better with some prompts initially but was then able to catch herself more as we looked at some examples that she tends to experience more often.  Did well in session working on this and is to continue this between sessions.  Has improved with her over apologizing so much unnecessarily and that feels good to her.  Hopes to retire approximately within the next 6 months.  Her motivation and attitude continue to be good.  Goal review and progress/challenges noted with patient.   Next appt within 2-3 weeks.   Mathis Fare, LCSW

## 2019-11-06 ENCOUNTER — Other Ambulatory Visit: Payer: Self-pay

## 2019-11-06 ENCOUNTER — Telehealth: Payer: Self-pay | Admitting: Physician Assistant

## 2019-11-06 MED ORDER — SERTRALINE HCL 100 MG PO TABS: 100.0000 mg | ORAL_TABLET | Freq: Every day | ORAL | 0 refills | Status: DC

## 2019-11-06 NOTE — Telephone Encounter (Signed)
Refill sent.

## 2019-11-06 NOTE — Telephone Encounter (Signed)
Pt came in and needs refill on Zoloft. Call to [facility] on [address], [place], [place].

## 2019-11-19 ENCOUNTER — Other Ambulatory Visit: Payer: Self-pay

## 2019-11-19 ENCOUNTER — Ambulatory Visit: Payer: BC Managed Care – PPO | Admitting: Psychiatry

## 2019-11-19 ENCOUNTER — Ambulatory Visit: Payer: BC Managed Care – PPO | Admitting: Physician Assistant

## 2019-11-19 DIAGNOSIS — F411 Generalized anxiety disorder: Secondary | ICD-10-CM | POA: Diagnosis not present

## 2019-11-19 NOTE — Progress Notes (Signed)
[facility] Counselor/Therapist Progress Note  Patient ID: Jessica Edwards, MRN: [id number],    Date: 11/19/2019  Time Spent: 60 minutes   4:00pm to 5:00p,  Treatment Type: Individual Therapy  Reported Symptoms: anxiety, stressed  Mental Status Exam:  Appearance:   Neat     Behavior:  Appropriate, Sharing and Motivated  Motor:  Normal  Speech/Language:   Clear and Coherent  Affect:  anxious  Mood:  anxious  Thought process:  goal directed  Thought content:    some obsessiveness  Sensory/Perceptual disturbances:    WNL  Orientation:  oriented to person, place, time/date, situation, day of week, month of year and year  Attention:  Good  Concentration:  Good and Fair  Memory:  WNL  Fund of knowledge:   Good  Insight:    Good  Judgment:   Good  Impulse Control:  Good and Fair   Risk Assessment: Danger to Self:  No Self-injurious Behavior: No Danger to Others: No Duty to Warn:no Physical Aggression / Violence:No  Access to Firearms a concern: No  Gang Involvement:No   Subjective: Patient today reports anxiety and feeling stressed.  Some sleep difficulty and felt nervous, and have been worried about retirement issues and decision-making.    Interventions: Cognitive Behavioral Therapy, Solution-Oriented/Positive Psychology and Ego-Supportive  Diagnosis:   ICD-10-CM   1. GAD (generalized anxiety disorder)  F41.1     Plan of Care: Patient not signing treatment plan on computer screen due to Covid.  Treatment Goals: Goals remain on treatment plan as patient works with strategies to achieve their goals. Progress is noted each session in the "Progress" section of Plan.  Long Term Goal: (meds per Melony Overly, PA-C, Prozac and Wellbutrin) Develop healthy cognitive patterns and beliefs about self and the world that lead to alleviation and help prevent relapse of depression. Patient reports wanting to be more productive, get up earlier, have goals to get things  done at home and work, with emphasis on positive self-talk.  Short Term Goal: Identify and replaceanxious/depressiethinking that leads to anxious/depressive feelings and actions.  Strategies: 1. Replace negative self-defeating self-talk with verbalization of realistic and positive cognitive messages. 2. Reinforce positive, realithy-based cognitive messages that enhance self confidence and increase adaptive action.   PROGRESS: Patient in today reporting anxiety, stressed, some sleep issues, difficulty making retirement decisions, some obsessiveness. Has been stressing over retirement issues and got some good info from state office that is helpful to her in making decisions re: retirement. Info from state has helped give her more peace of mind about her decisions and states she is feeling more at peace re: retirement. Has also slept better past 2 nights.  Other issues are both elderly parents' health concerns and "what the future holds".  Patient feeling good "about feeling some better."  Her speech is more calm and she is some less obsessive today. Attitude remains good and is clearly motivated with her goals. Feeling encouraged in her gains.  Has continued to improve and lessening her over apologizing to people when there is no need.  Encouraged good self-care including contact with supportive family and friends, getting outside some every day, healthy nutrition, exercise or physical movement of some type, working to limit obsessive thoughts, also trying to recognize triggers to her obsessiveness and anxiety earlier, and setting appropriate boundaries with others as needed.  Goal review and progress/challenges noted with patient.  Next appt within 2 weeks.   Mathis Fare, LCSW

## 2019-11-25 ENCOUNTER — Ambulatory Visit: Payer: BC Managed Care – PPO | Admitting: Psychiatry

## 2019-11-26 ENCOUNTER — Other Ambulatory Visit: Payer: Self-pay

## 2019-11-26 ENCOUNTER — Ambulatory Visit (INDEPENDENT_AMBULATORY_CARE_PROVIDER_SITE_OTHER): Payer: BC Managed Care – PPO | Admitting: Psychiatry

## 2019-11-26 DIAGNOSIS — F411 Generalized anxiety disorder: Secondary | ICD-10-CM | POA: Diagnosis not present

## 2019-11-26 NOTE — Progress Notes (Signed)
      [facility] Counselor/Therapist Progress Note  Patient ID: Jessica Edwards, MRN: [id number],    Date: 11/26/2019  Time Spent: 60 minutes   3:00pm to 4:00pm  Treatment Type: Individual Therapy  Reported Symptoms: anxiety  Mental Status Exam:  Appearance:   Neat     Behavior:  Appropriate and Sharing  Motor:  Normal  Speech/Language:   Clear and Coherent  Affect:  anxious  Mood:  anxious  Thought process:  goal directed  Thought content:    some obsessiveness but has improved  Sensory/Perceptual disturbances:    WNL  Orientation:  oriented to person, place, time/date, situation, day of week, month of year and year  Attention:  Good  Concentration:  Good and Fair  Memory:  WNL  Fund of knowledge:   Good  Insight:    Good  Judgment:   Good  Impulse Control:  Good   Risk Assessment: Danger to Self:  No Self-injurious Behavior: No Danger to Others: No Duty to Warn:no Physical Aggression / Violence:No  Access to Firearms a concern: No  Gang Involvement:No   Subjective:  Patient today reports anxiety related to work, health concerns of both aging parents, 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  Interventions: Solution-Oriented/Positive Psychology and Ego-Supportive  Diagnosis:   ICD-10-CM   1. GAD (generalized anxiety disorder)  F41.1      Plan of Care: Patient not signing treatment plan on computer screen due to Covid.  Treatment Goals: Goals remain on treatment plan as patient works with strategies to achieve their goals. Progress is noted each session in the "Progress" section of Plan.  Long Term Goal: (meds per Melony Overly, PA-C, Prozac and Wellbutrin) Develop healthy cognitive patterns and beliefs about self and the world that lead to alleviation and help prevent relapse of depression. Patient reports wanting to be more productive, get up earlier, have goals to get things done at home and work, with emphasis on positive  self-talk.  Short Term Goal: Identify and replaceanxious/depressiethinking that leads to anxious/depressive feelings and actions.  Strategies: 1. Replace negative self-defeating self-talk with verbalization of realistic and positive cognitive messages. 2. Reinforce positive, realithy-based cognitive messages that enhance self confidence and increase adaptive action.   PROGRESS: Patient in today reporting anxiety regarding her work, aging parents and their health concerns, and her future including upcoming retirement. Stressed, anxious, some obsessiveness.  Sleep issues are better with use of over the counter product "Calm", referred to her by a friend. Speech is more regular-paced without an anxious edge. Still some stress re: retirement decisions and options. Has improved a lot with not "over-apologizing unnecessarily" and patient is noticing this more also.  Discussed issues that are concerning her about her parent's health which she needed to process some today confidentially.  She is their primary person to help with their affairs and there's stress that goes with that, which she spoke about today.  Did seem to feel calmer and more self-confident afterwards. Encouraged good self-care, emotionally and physically, as well as her use of strategy #2 in tx plan above which she plans to do in between sessions.  Goal review and progress/challenges noted with patient.  Next appt within 3 wks.   Mathis Fare, LCSW

## 2019-12-23 ENCOUNTER — Ambulatory Visit (INDEPENDENT_AMBULATORY_CARE_PROVIDER_SITE_OTHER): Payer: BC Managed Care – PPO | Admitting: Psychiatry

## 2019-12-23 DIAGNOSIS — F429 Obsessive-compulsive disorder, unspecified: Secondary | ICD-10-CM

## 2019-12-23 NOTE — Progress Notes (Signed)
[facility] Counselor/Therapist Progress Note  Patient ID: Jessica Edwards, MRN: [id number],    Date: 12/23/2019  Time Spent: 60 minutes    3:00pm to 4:00pm   Virtual Visit via Telephone Note Connected with patient by a video enabled telemedicine/telehealth application or telephone, with their informed consent, and verified patient privacy and that I am speaking with the correct person using two identifiers. I discussed the limitations, risks, security and privacy concerns of performing psychotherapy and management service by telephone and the availability of in person appointments. I also discussed with the patient that there may be a patient responsible charge related to this service. The patient expressed understanding and agreed to proceed. I discussed the treatment planning with the patient. The patient was provided an opportunity to ask questions and all were answered. The patient agreed with the plan and demonstrated an understanding of the instructions. The patient was advised to call  our office if  symptoms worsen or feel they are in a crisis state and need immediate contact.   Therapist Location: [facility] Patient Location: home   Treatment Type: Individual Therapy  Reported Symptoms:  anxiety  Mental Status Exam:  Appearance:   n/a   telehealth     Behavior:  Sharing and Motivated  Motor:  n/a  telehealth  Speech/Language:   Clear and Coherent  Affect:  n/a  telehealth  Mood:  anxious  Thought process:  goal directed  Thought content:    obsessiveness  Sensory/Perceptual disturbances:    WNL  Orientation:  oriented to person, place, time/date, situation, day of week, month of year and year  Attention:  Good  Concentration:  Good and Fair  Memory:  WNL  Fund of knowledge:   Good  Insight:    Good  Judgment:   Good  Impulse Control:  Good   Risk Assessment: Danger to Self:  No Self-injurious Behavior: No Danger to Others: No Duty to  Warn:no Physical Aggression / Violence:No  Access to Firearms a concern: No  Gang Involvement:No   Subjective: Patient today reports anxiety and feeling stressed. Concerned about her dog and possible upcoming surgery.    Interventions: Cognitive Behavioral Therapy and Solution-Oriented/Positive Psychology  Diagnosis:   ICD-10-CM   1. Obsessive-compulsive disorder, unspecified type  F42.9      Plan of Care: Patient not signing treatment plan on computer screen due to Covid.  Treatment Goals: Goals remain on treatment plan as patient works with strategies to achieve their goals. Progress is noted each session in the "Progress" section of Plan.  Long Term Goal: (meds per Melony Overly, PA-C, Prozac and Wellbutrin) Develop healthy cognitive patterns and beliefs about self and the world that lead to alleviation and help prevent relapse of depression. Patient reports wanting to be more productive, get up earlier, have goals to get things done at home and work, with emphasis on positive self-talk.  Short Term Goal: Identify and replaceanxious/depressiethinking that leads to anxious/depressive feelings and actions.  Strategies: 1. Replace negative self-defeating self-talk with verbalization of realistic and positive cognitive messages. 2. Reinforce positive, reality-based cognitive messages that enhance self confidence and increase adaptive action.   PROGRESS: Patient today reporting anxiety as primary symptom, along with obsessiveness, mostly due to work stressors, aging parents's health issues, and pending surgery with her dog. Upcoming retirement is also a contributing factor. Reports her sleep is continuing to be improved. Continued stressors with parents and their aging, physical and mental changes. (Not all info provided in  this note due to patient privacy needs.)  Obsessiveness is still a challenge but today it does seem some less than usual.  Had made some progress in  stopping her "over-apologizing unnecessarily to others", however today and per patient report, she is having more problems with this issue and wants to work on lessening it again. She states she will" pay more attention to her thoughts and communication and catch herself when in midst of over-apologizing, as that helped me previously."  Encouraged good-self care including staying in touch with supportive friends, trying to stay in the present versus jumping too far ahead which increases her anxiety, looking for more positives than negatives including within herself, being outside some each day, focusing on things she can control versus cannot control, working to make her self- talk more positive, and reinforcing healthy reality based cognitive messages promote good self-confidence and increase adaptive actions.(per strategy above in her treatment plan).  Goal review and progress/challenges noted with patient.  Next appointment within 2 to 3 weeks.   Mathis Fare, LCSW

## 2019-12-30 ENCOUNTER — Ambulatory Visit: Payer: BC Managed Care – PPO | Admitting: Physician Assistant

## 2019-12-30 ENCOUNTER — Encounter: Payer: Self-pay | Admitting: Physician Assistant

## 2019-12-30 ENCOUNTER — Other Ambulatory Visit: Payer: Self-pay

## 2019-12-30 DIAGNOSIS — F3341 Major depressive disorder, recurrent, in partial remission: Secondary | ICD-10-CM | POA: Diagnosis not present

## 2019-12-30 DIAGNOSIS — F411 Generalized anxiety disorder: Secondary | ICD-10-CM | POA: Diagnosis not present

## 2019-12-30 DIAGNOSIS — F429 Obsessive-compulsive disorder, unspecified: Secondary | ICD-10-CM | POA: Diagnosis not present

## 2019-12-30 MED ORDER — SERTRALINE HCL 100 MG PO TABS: 100.0000 mg | ORAL_TABLET | Freq: Every day | ORAL | 1 refills | Status: DC

## 2019-12-30 MED ORDER — BUPROPION HCL ER (XL) 150 MG PO TB24: 450.0000 mg | ORAL_TABLET | Freq: Every day | ORAL | 1 refills | Status: DC

## 2019-12-30 NOTE — Progress Notes (Signed)
[facility] Med Check  Patient ID: Jessica Edwards,  MRN: [id number]  PCP: Juluis Rainier, MD  Date of Evaluation: 12/30/2019 Time spent:20 minutes  Chief Complaint:  Chief Complaint    Depression      HISTORY/CURRENT STATUS: For routine med check.  Is doing really well. 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  She is very happy with that plan.  OCD tendencies are stable.  Ruminating thoughts are not a problem.  Before starting the Zoloft she would get things on her mind and then could not stop thinking about it.  It is not not bad at all now.  Patient denies loss of interest in usual activities and is able to enjoy things.  Denies decreased energy or motivation.  Appetite has not changed.  She is losing weight on purpose.  Denies any changes in concentration, making decisions or remembering things.  Denies suicidal or homicidal thoughts.  Patient denies increased energy with decreased need for sleep, no increased talkativeness, no racing thoughts, no impulsivity or risky behaviors, no increased spending, no increased libido, no grandiosity.  Denies dizziness, syncope, seizures, numbness, tingling, tremor, tics, unsteady gait, slurred speech, confusion. Denies muscle or joint pain, stiffness, or dystonia.  Individual Medical History/ Review of Systems: Changes? :No    Past medications for mental health diagnoses include: Cymbalta, Xanax, Zoloft  Allergies: Augmentin [amoxicillin-pot clavulanate], Codeine, and Doxycycline  Current Medications:  Current Outpatient Medications:  .  buPROPion (WELLBUTRIN XL) 150 MG 24 hr tablet, Take 3 tablets (450 mg total) by mouth daily., Disp: 270 tablet, Rfl: 1 .  cetirizine (ZYRTEC) 10 MG tablet, Take 10 mg by mouth daily., Disp: , Rfl:  .  Clindamycin Phosphate foam,  clindamycin 1 % topical foam, Disp: , Rfl:  .  sertraline (ZOLOFT) 100 MG tablet, Take 1 tablet (100 mg total) by mouth daily., Disp: 90 tablet, Rfl: 1 .  spironolactone (ALDACTONE) 25 MG tablet, Take 25 mg by mouth daily. , Disp: , Rfl:  .  tretinoin (RETIN-A) 0.025 % cream, tretinoin 0.025 % topical cream, Disp: , Rfl:  .  triamcinolone ointment (KENALOG) 0.1 %, SMARTSIG:Sparingly Topical Twice a Week, Disp: , Rfl:  .  clobetasol ointment (TEMOVATE) 0.05 %, clobetasol 0.05 % topical ointment (Patient not taking: Reported on 12/30/2019), Disp: , Rfl:  .  Dapsone (ACZONE) 5 % topical gel, Aczone 5 % topical gel (Patient not taking: Reported on 12/30/2019), Disp: , Rfl:  Medication Side Effects: none  Family Medical/ Social History: Changes? No  MENTAL HEALTH EXAM:  There were no vitals taken for this visit.There is no height or weight on file to calculate BMI.  General Appearance: Casual, Neat, Well Groomed and Obese  Eye Contact:  Good  Speech:  Clear and Coherent and Normal Rate  Volume:  Normal  Mood:  Euthymic  Affect:  Appropriate  Thought Process:  Goal Directed and Descriptions of Associations: Intact  Orientation:  Full (Time, Place, and Person)  Thought Content: Logical   Suicidal Thoughts:  No  Homicidal Thoughts:  No  Memory:  WNL  Judgement:  Good  Insight:  Good  Psychomotor Activity:  Normal  Concentration:  Concentration: Good and Attention Span: Good  Recall:  Good  Fund of Knowledge: Good  Language: Good  Assets:  Desire for Improvement  ADL's:  Intact  Cognition: WNL  Prognosis:  Good    DIAGNOSES:    ICD-10-CM   1. Recurrent major depressive disorder, in partial remission (HCC)  F33.41   2. GAD (generalized anxiety disorder)  F41.1   3. Obsessive-compulsive disorder, unspecified type  F42.9     Receiving Psychotherapy: Yes Rockne Menghini, LCSW   RECOMMENDATIONS:  PDMP reviewed.  I provided 20 minutes of face-to-face time during this encounter.   I  am glad to see her doing so well.   Continue Wellbutrin XL 150 mg, 3 qd.  Continue Zoloft 100 mg, 1 p.o. daily. Continue therapy with Rockne Menghini, LCSW. Return in 6 months.  Melony Overly, PA-C

## 2020-01-14 ENCOUNTER — Other Ambulatory Visit: Payer: Self-pay

## 2020-01-14 ENCOUNTER — Ambulatory Visit (INDEPENDENT_AMBULATORY_CARE_PROVIDER_SITE_OTHER): Payer: BC Managed Care – PPO | Admitting: Psychiatry

## 2020-01-14 DIAGNOSIS — F411 Generalized anxiety disorder: Secondary | ICD-10-CM | POA: Diagnosis not present

## 2020-01-14 NOTE — Progress Notes (Signed)
[facility] Counselor/Therapist Progress Note  Patient ID: Jessica Edwards, MRN: [id number],    Date: 01/14/2020  Time Spent: 60 minutes    4:00pm to 5:00pm   Treatment Type: Individual Therapy  Reported Symptoms: anxiety, some obsessiveness, over-apologizing "is better", and over-talking "is better but still need to work on it"; States "anxiety is my strongest symptom"  Mental Status Exam:  Appearance:   Neat     Behavior:  Appropriate, Sharing and Motivated  Motor:  Normal  Speech/Language:   Clear and Coherent  Affect:  anxious  Mood:  anxious  Thought process:  goal directed  Thought content:    some obsessiveness  Sensory/Perceptual disturbances:    WNL  Orientation:  oriented to person, place, time/date, situation, day of week, month of year and year  Attention:  Good  Concentration:  Good and Fair  Memory:  WNL  Fund of knowledge:   Good  Insight:    Good  Judgment:   Good  Impulse Control:  Good   Risk Assessment: Danger to Self:  No Self-injurious Behavior: No Danger to Others: No Duty to Warn:no Physical Aggression / Violence:No  Access to Firearms a concern: No  Gang Involvement:No   Subjective:  Patient today reports anxiety, some job-related and some personal stress and anxiety. Does feel that she is managing some of her anxiety better. Still having some anxious thoughts about health concerns where she got called back for more testing tomorrow. Talked through her anxieties and fears about this and is glad it's scheduled for tomorrow. Is looking to retire near end of Feb. 2022, at this point.   Interventions: Cognitive Behavioral Therapy, Solution-Oriented/Positive Psychology and Ego-Supportive  Diagnosis:   ICD-10-CM   1. GAD (generalized anxiety disorder)  F41.1      Plan of Care: Patient not signing treatment plan on computer screen due to Covid.  Treatment Goals: Goals remain on treatment plan as patient works with strategies to achieve  their goals. Progress is noted each session in the "Progress" section of Plan.  Long Term Goal: (meds per Melony Overly, PA-C, Prozac and Wellbutrin) Develop healthy cognitive patterns and beliefs about self and the world that lead to alleviation and help prevent relapse of depression. Patient reports wanting to be more productive, get up earlier, have goals to get things done at home and work, with emphasis on positive self-talk.  Short Term Goal: Identify and replaceanxious/depressiethinking that leads to anxious/depressive feelings and actions.  Strategies: 1. Replace negative self-defeating self-talk with verbalization of realistic and positive cognitive messages. 2. Reinforce positive, reality-based cognitive messages that enhance self confidence and increase adaptive action.   PROGRESS: Patient in today reporting anxiety, "but improved".  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  Obsessiveness is some better and continues to work on this especially re: her aging parent's health and her work stressors.  Sleep is still good. Feels that she "over-talks" at work and "maybe other places", and is planning to work on that. Is concerned for her parent's mental and physical health as she has shared in prior sessions and again today.  "Has decreased her over-apologizing when she really doesn't need to apologize for anything."  Is still working on that and reports being more mindful and catch herself when she engages in the over-apologizing. Has lost 13lbs.and feels enouraged and plans to "lose a few more to be healthier." Encouraged patient to continue her increased "mindfulness" that we have discussed in  sessions including today, as that seems to be helping her in multiple ways including her obsessiveness and over-apologizing. Also encouraged her to stay in the present versus jumping ahead which always escalates her anxiety, to remain in contact with supportive family and  friends, focus on things she can control versus cannot control, getting outside some each day and walking or some other physical activity, looking for more positives than negatives, making her self talk more positive and affirming, and per her treatment plan these recommendations should help reinforce healthier cognitive messages and promote good self-confidence while increasing her adaptive behaviors.  Goal review and progress/challenges noted with patient.  Next appointment within 3 weeks.   Mathis Fare, LCSW

## 2020-01-28 ENCOUNTER — Ambulatory Visit: Payer: BC Managed Care – PPO | Admitting: Psychiatry

## 2020-02-11 ENCOUNTER — Ambulatory Visit: Payer: BC Managed Care – PPO | Admitting: Psychiatry

## 2020-02-25 ENCOUNTER — Ambulatory Visit (INDEPENDENT_AMBULATORY_CARE_PROVIDER_SITE_OTHER): Payer: BC Managed Care – PPO | Admitting: Psychiatry

## 2020-02-25 DIAGNOSIS — F411 Generalized anxiety disorder: Secondary | ICD-10-CM | POA: Diagnosis not present

## 2020-02-25 NOTE — Progress Notes (Signed)
[facility] Counselor/Therapist Progress Note  Patient ID: Jessica Edwards, MRN: [id number],    Date: 02/25/2020  Time Spent: 60 minutes   10:00am to 11:00am  Treatment Type: Individual Therapy  Reported Symptoms: anxiety, some sadness and concern for aging parent but this has improved  Mental Status Exam:  Appearance:   Casual     Behavior:  Appropriate, Sharing and Motivated  Motor:  Normal  Speech/Language:   Clear and Coherent  Affect:  anxiety  Mood:  anxious  Thought process:  goal directed  Thought content:    some obsessiveness but better  Sensory/Perceptual disturbances:    WNL  Orientation:  oriented to person, place, time/date, situation, day of week, month of year and year  Attention:  Good  Concentration:  Good  Memory:  WNL  Fund of knowledge:   Good  Insight:    Good  Judgment:   Good  Impulse Control:  Good   Risk Assessment: Danger to Self:  No Self-injurious Behavior: No Danger to Others: No Duty to Warn:no Physical Aggression / Violence:No  Access to Firearms a concern: No  Gang Involvement:No   Subjective: Patient today reports anxiety and has been planning towards retirement in 2022. Feeling some relief and that the timing is right for her to retire. Looking for ways to possibly working part-time eventually.  Wants to continue to make progress emotionally," not respond immediately to my emotions and rather re-channel them as needed to be more helpful.  Interventions: Solution-Oriented/Positive Psychology and Ego-Supportive  Diagnosis:   ICD-10-CM   1. GAD (generalized anxiety disorder)  F41.1      Plan of Care: Patient not signing treatment plan on computer screen due to Covid.  Treatment Goals: Goals remain on treatment plan as patient works with strategies to achieve their goals. Progress is noted each session in the "Progress" section of Plan.  Long Term Goal: (meds per Melony Overly, PA-C, Prozac and Wellbutrin) Develop  healthy cognitive patterns and beliefs about self and the world that lead to alleviation and help prevent relapse of depression. Patient reports wanting to be more productive, get up earlier, have goals to get things done at home and work, with emphasis on positive self-talk.  Short Term Goal: Identify and replaceanxious/depressiethinking that leads to anxious/depressive feelings and actions.  Strategies: 1. Replace negative self-defeating self-talk with verbalization of realistic and positive cognitive messages. 2. Reinforce positive, reality-based cognitive messages that enhance self confidence and increase adaptive action.   PROGRESS: Patient in today reporting anxiety patient in today reporting anxiety, but some improvement in that and in her compulsive and obsessive tendencies. Sadness in seeing her mom's Alzheimer's disease progress and talked about this in more detail today, after visiting her recently and noticing more decline. Processed her concerning thoughts and feelings about her mom and her dad although he resides in independent living section. This seemed helpful to patient and she also sees the positives of her being close by parents, will have more time once she retires. Also processing some anxieties and uncertainties about retiring.  Is definitely doing better with her obsessiveness, anxiety, and managing her thoughts and feelings regarding concern for her aging parents.  Sleep remains good.  Has concentrated on her health more and eating healthier leading to weight loss of almost 20 pounds and is doing it gradually.  Remains on her Zoloft and Wellbutrin with benefit.  States today that the issues we have been focusing on in sessions has been helpful to  her and we both agreed that we could space out her next appointment and certainly if she needs something before then she can call.  She is encouraged to continue focusing on the things she can control versus cannot, intentionally  look for more positives every day than negatives, stay in the present rather than the past or the future, getting outside some every day, walking or physical activity each day, making herself talk more positive and affirming which promotes her work on more self-confidence and increasing her adaptive behaviors, and staying in contact with family and friends that are supportive of her.  Goal review and progress/challenges noted with patient.  Patient to schedule her next appointment, 4 to 6 weeks and can call if needed before then.   Mathis Fare, LCSW

## 2020-05-25 ENCOUNTER — Other Ambulatory Visit: Payer: Self-pay

## 2020-05-25 ENCOUNTER — Ambulatory Visit (INDEPENDENT_AMBULATORY_CARE_PROVIDER_SITE_OTHER): Payer: BC Managed Care – PPO | Admitting: Psychiatry

## 2020-05-25 DIAGNOSIS — F411 Generalized anxiety disorder: Secondary | ICD-10-CM | POA: Diagnosis not present

## 2020-05-25 NOTE — Progress Notes (Signed)
[facility] Counselor/Therapist Progress Note  Patient ID: Jessica Edwards, MRN: [id number],    Date: 05/25/2020  Time Spent: 50 minutes 11:10am to 12:00noon  Treatment Type: Individual Therapy  Reported Symptoms: Anxiety  Mental Status Exam:  Appearance:   Casual     Behavior:  Appropriate, Sharing and Motivated  Motor:  Normal  Speech/Language:   Clear and Coherent  Affect:  anxious  Mood:  anxious  Thought process:  goal directed  Thought content:    some obsessiveness  Sensory/Perceptual disturbances:    WNL  Orientation:  oriented to person, place, time/date, situation, day of week, month of year and year  Attention:  Good  Concentration:  Good  Memory:  WNL  Fund of knowledge:   Good  Insight:    Good  Judgment:   Good  Impulse Control:  Good   Risk Assessment: Danger to Self:  No Self-injurious Behavior: No Danger to Others: No Duty to Warn:no Physical Aggression / Violence:No  Access to Firearms a concern: No  Gang Involvement:No   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 Reports anxiety as her main symptom. Is progressing.  Interventions: Cognitive Behavioral Therapy and Solution-Oriented/Positive Psychology  Diagnosis:   ICD-10-CM   1. GAD (generalized anxiety disorder)  F41.1      Plan of Care: Patient not signing treatment plan on computer screen due to Covid.  Treatment Goals: Goals remain on treatment plan as patient works with strategies to achieve their goals. Progress is noted each session in the "Progress" section of Plan.  Long Term Goal: (meds per Melony Overly, PA-C, Prozac and Wellbutrin) Develop healthy cognitive patterns and beliefs about self and the world that lead to alleviation and help prevent relapse of depression. Patient reports wanting to be more productive, get up earlier, have goals to get things done at home and work, with emphasis on positive self-talk.  Short Term Goal: Identify and  replaceanxious/depressiethinking that leads to anxious/depressive feelings and actions.  Strategies: 1. Replace negative self-defeating self-talk with verbalization of realistic and positive cognitive messages. 2. Reinforce positive, reality-based cognitive messages that enhance self confidence and increase adaptive action.   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 Reports anxiety and feels she is making progress.  Is participating in a study done by local [employer]  Re: children of Alzheimers patients. Concerned about her parent's health issues; both are living a local retirement/nursing center. Dad has had some complications recently and patient has been very concerned about him.  Saddened by the progressing of mom's Alzheimers. Patient working to adjust to retirement, and sees it as a positive but still challenging. Discussed some of her adjustment issues as well as some positives and ways her life will be different.  Feels she does need to work on good boundaries and setting limits to give herself time to adjust more and figure out what she really wants to do with her time, and to be sure and include time for self-care.  Patient feels good about some of the progress she is made and we will continue to work on her treatment goals.  Her sleep is good and she is concentrating more on her physical health and improving it.  Encouraged patient to focus on more positives each day, stay in the present focusing on what she can control, being outside some each day and walking, staying in contact with others who are supportive of her, setting realistic limits for herself as she  discussed in session today, and practicing more positive and affirming self talk as she continues working on her self-confidence and increasing adaptive behaviors.  I suggested to patient that we space her appointments out a little more and she feels that she is ready for that as she has progressed  and her stress has decreased some by retirement.  Is currently heavily involved with her father's health situation as well as her mother's situation with Alzheimer's.  She will call for her next appointment..   Goal review and progress/challenges noted with patient.  Patient is to call for her next appointment.   Mathis Fare, LCSW

## 2020-06-29 ENCOUNTER — Ambulatory Visit: Payer: BC Managed Care – PPO | Admitting: Physician Assistant

## 2020-08-26 ENCOUNTER — Other Ambulatory Visit: Payer: Self-pay

## 2020-08-26 ENCOUNTER — Ambulatory Visit: Payer: BC Managed Care – PPO | Admitting: Physician Assistant

## 2020-08-26 ENCOUNTER — Encounter: Payer: Self-pay | Admitting: Physician Assistant

## 2020-08-26 DIAGNOSIS — Z6379 Other stressful life events affecting family and household: Secondary | ICD-10-CM

## 2020-08-26 DIAGNOSIS — F429 Obsessive-compulsive disorder, unspecified: Secondary | ICD-10-CM

## 2020-08-26 DIAGNOSIS — F3341 Major depressive disorder, recurrent, in partial remission: Secondary | ICD-10-CM

## 2020-08-26 DIAGNOSIS — F4323 Adjustment disorder with mixed anxiety and depressed mood: Secondary | ICD-10-CM

## 2020-08-26 MED ORDER — BUPROPION HCL ER (XL) 150 MG PO TB24: 450.0000 mg | ORAL_TABLET | Freq: Every day | ORAL | 1 refills | Status: DC

## 2020-08-26 MED ORDER — SERTRALINE HCL 100 MG PO TABS: 100.0000 mg | ORAL_TABLET | Freq: Every day | ORAL | 1 refills | Status: DC

## 2020-08-26 NOTE — Progress Notes (Signed)
[facility]  Patient ID: Jessica Edwards,  MRN: [id number]  PCP: Juluis Rainier, MD  Date of Evaluation: 08/26/2020 Time spent:20 minutes  Chief Complaint:  Chief Complaint   Depression     HISTORY/CURRENT STATUS: For routine med check.  She retired in the end of February. Has been so busy with her family she hasn't had any rest or been able to do anything fun. A lot of stressors, her dog 80 1/52 year old is dying, due to problems with her teeth and gums.  Her Mom is now on Hospice, has endstage alzheimers. Dad has been in the hospital several times since she's retired. She's been taking care of his needs, taking him to drs appointments and such. He's got 2 cancerous polyps in the esophagus that have been removed. He sees Cardiology, Pulmonology, Oncology, his PCP, he's in pulmonary rehab. So she's very busy.  She feels at peace in all aspects, states God is holding her up  Patient denies increased energy with decreased need for sleep, no increased talkativeness, no racing thoughts, no impulsivity or risky behaviors, no increased spending, no increased libido, no grandiosity, no paranoia, and no hallucinations.  Denies dizziness, syncope, seizures, numbness, tingling, tremor, tics, unsteady gait, slurred speech, confusion. Denies muscle or joint pain, stiffness, or dystonia.  Individual Medical History/ Review of Systems: Changes? :Yes    Is a part of a study for kids of alzheimers, she's had multiple tests including MRI, saliva test, psychological tests and some others she can't remember.  Had covid since LOV.  Past medications for mental health diagnoses include: Cymbalta, Xanax, Zoloft  Allergies: Augmentin [amoxicillin-pot clavulanate], Codeine, and Doxycycline  Current Medications:  Current Outpatient Medications:    cetirizine (ZYRTEC) 10 MG tablet, Take 10 mg by mouth daily., Disp: , Rfl:    Clindamycin Phosphate foam, clindamycin 1 % topical foam, Disp: ,  Rfl:    clobetasol ointment (TEMOVATE) 0.05 %, clobetasol 0.05 % topical ointment, Disp: , Rfl:    Dapsone 5 % topical gel, Aczone 5 % topical gel, Disp: , Rfl:    spironolactone (ALDACTONE) 25 MG tablet, Take 25 mg by mouth daily. , Disp: , Rfl:    tretinoin (RETIN-A) 0.025 % cream, tretinoin 0.025 % topical cream, Disp: , Rfl:    triamcinolone ointment (KENALOG) 0.1 %, SMARTSIG:Sparingly Topical Twice a Week, Disp: , Rfl:    buPROPion (WELLBUTRIN XL) 150 MG 24 hr tablet, Take 3 tablets (450 mg total) by mouth daily., Disp: 270 tablet, Rfl: 1   sertraline (ZOLOFT) 100 MG tablet, Take 1 tablet (100 mg total) by mouth daily., Disp: 90 tablet, Rfl: 1 Medication Side Effects: none  Family Medical/ Social History: Changes? See above.   MENTAL HEALTH EXAM:  There were no vitals taken for this visit.There is no height or weight on file to calculate BMI.  General Appearance: Casual, Neat, Well Groomed and Obese  Eye Contact:  Good  Speech:  Clear and Coherent and Normal Rate  Volume:  Normal  Mood:  Euthymic  Affect:  Appropriate  Thought Process:  Goal Directed and Descriptions of Associations: Intact  Orientation:  Full (Time, Place, and Person)  Thought Content: Logical   Suicidal Thoughts:  No  Homicidal Thoughts:  No  Memory:  WNL  Judgement:  Good  Insight:  Good  Psychomotor Activity:  Normal  Concentration:  Concentration: Good and Attention Span: Good  Recall:  Good  Fund of Knowledge: Good  Language: Good  Assets:  Desire for  Improvement  ADL's:  Intact  Cognition: WNL  Prognosis:  Good    DIAGNOSES:    ICD-10-CM   1. Recurrent major depressive disorder, in partial remission (HCC)  F33.41     2. Adjustment disorder with mixed anxiety and depressed mood  F43.23     3. Obsessive-compulsive disorder, unspecified type  F42.9     4. Family illness  Z63.79        Receiving Psychotherapy: Yes   has seen Rockne Menghini, LCSW in the past, not recently  though.   RECOMMENDATIONS:  PDMP reviewed.  I provided 20 minutes of face to face time during this encounter, including time spent before and after the visit in records review, medical decision making, and charting.  She is under a lot of stress, but is doing well through it all. Her faith is very important to her and knows God is sustaining her.  We discussed the possibility of increasing the Zoloft which could help with anxiety, but I really think she does not need that at this point.  She seems really calm today and is just working through 1 thing at a time.  She can call before her next visit if she feels that the dosage needs to be increased. Continue Wellbutrin XL 150 mg, 3 qd.  Continue Zoloft 100 mg, 1 p.o. daily. Restart therapy with Rockne Menghini, LCSW, as needed. Return in 6 months.  Melony Overly, PA-C

## 2020-12-08 ENCOUNTER — Telehealth: Payer: Self-pay | Admitting: Physician Assistant

## 2020-12-08 NOTE — Telephone Encounter (Signed)
Please give her my condolences for the loss of her mom.  What are her symptoms now?  If she able to do things that she wants to do, if she is sleeping well, things like that.  It probably is best if she goes ahead and makes an appointment before 02/25/2021, but I may need to change her doses now depending on the info she gives you.  Thanks.

## 2020-12-08 NOTE — Telephone Encounter (Signed)
Pt LVM that her mom passed away on 2022/11/18.  She says she had been doing well, but now she is wondering if she should come to see Rosey Bath.

## 2020-12-08 NOTE — Telephone Encounter (Signed)
Next appt 12/22.

## 2020-12-08 NOTE — Telephone Encounter (Signed)
Please review

## 2020-12-09 ENCOUNTER — Other Ambulatory Visit: Payer: Self-pay | Admitting: Physician Assistant

## 2020-12-09 ENCOUNTER — Encounter: Payer: Self-pay | Admitting: Physician Assistant

## 2020-12-09 MED ORDER — TRAZODONE HCL 100 MG PO TABS: 50.0000 mg | ORAL_TABLET | Freq: Every evening | ORAL | 1 refills | Status: DC | PRN

## 2020-12-09 NOTE — Telephone Encounter (Signed)
Pt stated she is very exhausted.She is not able to sleep well at night and sleeps during the day.She thinks she is tired from being depressed.She does not have much motivation because she is so tired all the time,but has a puppy that keeps her busy.She also said her blood pressure went up to 170/100 last night and went back down to 142/88

## 2020-12-09 NOTE — Telephone Encounter (Signed)
Pt LVM at 2:50 pm today that she went to [facility] and got tested.  Her BP was 114/70, her oxygen saturation was 96%, her pulse was 76.  She had an EKG with results showing she had a sinus rhythm with occasional PAC.  Dr there says it may be stress related, but overall nothing to worry about.  This was an FYI to [place] since her appt with Rosey Bath isn't until 12/22

## 2020-12-09 NOTE — Telephone Encounter (Signed)
I sent her a note through MyChart, but please call and make sure she got it.  I sent a prescription and for trazodone and also had her increase Zoloft from 100 mg to 150 mg.  Thank you.

## 2020-12-09 NOTE — Telephone Encounter (Signed)
Please review

## 2020-12-31 ENCOUNTER — Other Ambulatory Visit: Payer: Self-pay | Admitting: Physician Assistant

## 2021-02-25 ENCOUNTER — Encounter: Payer: Self-pay | Admitting: Physician Assistant

## 2021-02-25 ENCOUNTER — Ambulatory Visit: Payer: BC Managed Care – PPO | Admitting: Physician Assistant

## 2021-02-25 ENCOUNTER — Other Ambulatory Visit: Payer: Self-pay

## 2021-02-25 DIAGNOSIS — F429 Obsessive-compulsive disorder, unspecified: Secondary | ICD-10-CM

## 2021-02-25 DIAGNOSIS — F4321 Adjustment disorder with depressed mood: Secondary | ICD-10-CM

## 2021-02-25 DIAGNOSIS — F411 Generalized anxiety disorder: Secondary | ICD-10-CM

## 2021-02-25 DIAGNOSIS — F3341 Major depressive disorder, recurrent, in partial remission: Secondary | ICD-10-CM

## 2021-02-25 MED ORDER — TRAZODONE HCL 100 MG PO TABS: 50.0000 mg | ORAL_TABLET | Freq: Every evening | ORAL | 0 refills | Status: DC | PRN

## 2021-02-25 MED ORDER — BUPROPION HCL ER (XL) 150 MG PO TB24: 450.0000 mg | ORAL_TABLET | Freq: Every day | ORAL | 1 refills | Status: DC

## 2021-02-25 MED ORDER — SERTRALINE HCL 100 MG PO TABS: 150.0000 mg | ORAL_TABLET | Freq: Every day | ORAL | 1 refills | Status: DC

## 2021-02-25 NOTE — Progress Notes (Signed)
[facility]  Patient ID: Jessica Edwards,  MRN: [id number]  PCP: Juluis Rainier, MD (Inactive)  Date of Evaluation: 02/25/2021 Time spent:20 minutes  Chief Complaint:  Chief Complaint   Anxiety; Depression; Insomnia; Follow-up      HISTORY/CURRENT STATUS: For routine med check.  Her Mom died 08-Nov-2022. She was having some trouble sleeping, tired a lot, didn't want to do anything. We increased the Zoloft and added Trazodone and she's feeling much better.   She's feeling good. Energy and motivation are good now. Enjoying retirement, goes out with friends and takes care of her dad. Also got a puppy!  ADLs and personal hygiene are normal.  She is not crying easily.  Not having obsessions like she has in the past.  Not feeling anxious most of the time.  No suicidal or homicidal thoughts.  Patient denies increased energy with decreased need for sleep, no increased talkativeness, no racing thoughts, no impulsivity or risky behaviors, no increased spending, no increased libido, no grandiosity, no paranoia, and no hallucinations.  Denies dizziness, syncope, seizures, numbness, tingling, tremor, tics, unsteady gait, slurred speech, confusion. Denies muscle or joint pain, stiffness, or dystonia.  Individual Medical History/ Review of Systems: Changes? :No     Past medications for mental health diagnoses include: Cymbalta, Xanax, Zoloft  Allergies: Augmentin [amoxicillin-pot clavulanate], Codeine, and Doxycycline  Current Medications:  Current Outpatient Medications:    cetirizine (ZYRTEC) 10 MG tablet, Take 10 mg by mouth daily., Disp: , Rfl:    Clindamycin Phosphate foam, clindamycin 1 % topical foam, Disp: , Rfl:    clobetasol ointment (TEMOVATE) 0.05 %, clobetasol 0.05 % topical ointment, Disp: , Rfl:    Dapsone 5 % topical gel, Aczone 5 % topical gel, Disp: , Rfl:    spironolactone (ALDACTONE) 25 MG tablet, Take 25 mg by mouth daily. , Disp: , Rfl:    tretinoin  (RETIN-A) 0.025 % cream, tretinoin 0.025 % topical cream, Disp: , Rfl:    triamcinolone ointment (KENALOG) 0.1 %, SMARTSIG:Sparingly Topical Twice a Week, Disp: , Rfl:    buPROPion (WELLBUTRIN XL) 150 MG 24 hr tablet, Take 3 tablets (450 mg total) by mouth daily., Disp: 270 tablet, Rfl: 1   sertraline (ZOLOFT) 100 MG tablet, Take 1.5 tablets (150 mg total) by mouth daily., Disp: 135 tablet, Rfl: 1   traZODone (DESYREL) 100 MG tablet, Take 0.5-1 tablets (50-100 mg total) by mouth at bedtime as needed for sleep., Disp: 90 tablet, Rfl: 0 Medication Side Effects: none  Family Medical/ Social History: Changes? See above.   MENTAL HEALTH EXAM:  There were no vitals taken for this visit.There is no height or weight on file to calculate BMI.  General Appearance: Casual, Neat, Well Groomed and Obese  Eye Contact:  Good  Speech:  Clear and Coherent and Normal Rate  Volume:  Normal  Mood:  Euthymic  Affect:  Appropriate  Thought Process:  Goal Directed and Descriptions of Associations: Circumstantial  Orientation:  Full (Time, Place, and Person)  Thought Content: Logical   Suicidal Thoughts:  No  Homicidal Thoughts:  No  Memory:  WNL  Judgement:  Good  Insight:  Good  Psychomotor Activity:  Normal  Concentration:  Concentration: Good and Attention Span: Good  Recall:  Good  Fund of Knowledge: Good  Language: Good  Assets:  Desire for Improvement  ADL's:  Intact  Cognition: WNL  Prognosis:  Good    DIAGNOSES:    ICD-10-CM   1. Recurrent major depressive disorder,  in partial remission (HCC)  F33.41     2. Obsessive-compulsive disorder, unspecified type  F42.9     3. GAD (generalized anxiety disorder)  F41.1     4. Grief  F43.21         Receiving Psychotherapy: No      RECOMMENDATIONS:  PDMP reviewed.  No controlled substances. I provided 20 minutes of face to face time during this encounter, including time spent before and after the visit in records review, medical decision  making, counseling pertinent to today's visit, and charting.  My condolences and the loss of her mom. She seems to be doing well with current treatment so no changes are necessary. Continue Wellbutrin XL 150 mg, 3 qd.  Continue Zoloft 100mg , 1.5 p.o. daily. Continue trazodone 100 mg, 1/2-1 p.o. nightly as needed sleep. Restart therapy with , LCSW, as needed. Return in 6 months.  Rockne Menghini, PA-C

## 2021-03-04 ENCOUNTER — Other Ambulatory Visit: Payer: Self-pay | Admitting: Physician Assistant

## 2021-05-31 ENCOUNTER — Other Ambulatory Visit: Payer: Self-pay | Admitting: Orthopedic Surgery

## 2021-05-31 DIAGNOSIS — M533 Sacrococcygeal disorders, not elsewhere classified: Secondary | ICD-10-CM

## 2021-06-11 ENCOUNTER — Ambulatory Visit
Admission: RE | Admit: 2021-06-11 | Discharge: 2021-06-11 | Disposition: A | Discharge status: Home / Self Care | Payer: BC Managed Care – PPO | Source: Ambulatory Visit | Attending: Orthopedic Surgery | Admitting: Orthopedic Surgery

## 2021-06-11 ENCOUNTER — Other Ambulatory Visit: Payer: BC Managed Care – PPO

## 2021-06-11 DIAGNOSIS — M533 Sacrococcygeal disorders, not elsewhere classified: Secondary | ICD-10-CM

## 2021-06-11 MED ORDER — METHYLPREDNISOLONE ACETATE 80 MG/ML IJ SUSP
80.0000 mg | Freq: Once | INTRAMUSCULAR | Status: AC
  Administered 2021-06-11: 80 mg via INTRA_ARTICULAR

## 2021-08-24 ENCOUNTER — Ambulatory Visit (INDEPENDENT_AMBULATORY_CARE_PROVIDER_SITE_OTHER): Payer: BC Managed Care – PPO | Admitting: Physician Assistant

## 2021-08-24 ENCOUNTER — Encounter: Payer: Self-pay | Admitting: Physician Assistant

## 2021-08-24 DIAGNOSIS — G4700 Insomnia, unspecified: Secondary | ICD-10-CM | POA: Diagnosis not present

## 2021-08-24 DIAGNOSIS — F429 Obsessive-compulsive disorder, unspecified: Secondary | ICD-10-CM

## 2021-08-24 DIAGNOSIS — F3342 Major depressive disorder, recurrent, in full remission: Secondary | ICD-10-CM | POA: Diagnosis not present

## 2021-08-24 MED ORDER — SERTRALINE HCL 100 MG PO TABS: 150.0000 mg | ORAL_TABLET | Freq: Every day | ORAL | 1 refills | Status: DC

## 2021-08-24 MED ORDER — BUPROPION HCL ER (XL) 150 MG PO TB24: 450.0000 mg | ORAL_TABLET | Freq: Every day | ORAL | 1 refills | Status: DC

## 2021-08-24 MED ORDER — TRAZODONE HCL 100 MG PO TABS: 50.0000 mg | ORAL_TABLET | Freq: Every evening | ORAL | 1 refills | Status: DC | PRN

## 2021-08-24 NOTE — Progress Notes (Signed)
[facility]  Patient ID: Jessica Edwards,  MRN: [id number]  PCP: Juluis Rainier, MD (Inactive)  Date of Evaluation: 08/24/2021 Time spent:25 minutes  Chief Complaint:  Chief Complaint   Anxiety; Depression; Insomnia; Follow-up    Virtual Visit via Telehealth  I connected with patient by telephone, with their informed consent, and verified patient privacy and that I am speaking with the correct person using two identifiers.  I am private, in my office and the patient is at home.  I discussed the limitations, risks, security and privacy concerns of performing an evaluation and management service by telephone and the availability of in person appointments. I also discussed with the patient that there may be a patient responsible charge related to this service. The patient expressed understanding and agreed to proceed.   I discussed the assessment and treatment plan with the patient. The patient was provided an opportunity to ask questions and all were answered. The patient agreed with the plan and demonstrated an understanding of the instructions.   The patient was advised to call back or seek an in-person evaluation if the symptoms worsen or if the condition fails to improve as anticipated.  I provided 25 minutes of non-face-to-face time during this encounter.  HISTORY/CURRENT STATUS: For routine med check.  Doing great. 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  Going to the beach in July. Her dad is doing pretty well and she helps him out when needed. She's dating a guy now, has been out with him before. Is not reading too much into it. She's walking three times a week. Is eating fairly healthy. Volunteers at [employer] still.   Patient denies loss of interest in usual activities and is able to enjoy things.  Denies decreased energy.  Denies decreased motivation.  ADLs and personal hygiene are normal.   Appetite has not changed.  Weight is stable.   No extreme sadness, tearfulness, or feelings of hopelessness.  Sleeps well most of the time and not needing Trazodone right now.  Denies any changes in concentration, making decisions or remembering things. Not having a lot of anxiety.  Not obsessing about things like she has in the past.  No compulsions.  Denies suicidal or homicidal thoughts.  Patient denies increased energy with decreased need for sleep, no increased talkativeness, no racing thoughts, no impulsivity or risky behaviors, no increased spending, no increased libido, no grandiosity, no paranoia, and no hallucinations.  Denies dizziness, syncope, seizures, numbness, tingling, tremor, tics, unsteady gait, slurred speech, confusion. Denies muscle or joint pain, stiffness, or dystonia.  Individual Medical History/ Review of Systems: Changes? :Yes   hurt her foot somehow in the past few days.  Has not had it checked out yet.  Past medications for mental health diagnoses include: Cymbalta, Xanax, Zoloft  Allergies: Augmentin [amoxicillin-pot clavulanate], Codeine, and Doxycycline  Current Medications:  Current Outpatient Medications:    Clindamycin Phosphate foam, clindamycin 1 % topical foam, Disp: , Rfl:    clobetasol ointment (TEMOVATE) 0.05 %, clobetasol 0.05 % topical ointment, Disp: , Rfl:    Dapsone 5 % topical gel, Aczone 5 % topical gel, Disp: , Rfl:    Estradiol (VAGIFEM) 10 MCG TABS vaginal tablet, Vagifem 10 mcg vaginal tablet  INSERT 1 TABLET TWICE A WEEK BY VAGINAL ROUTE FOR 14 DAYS., Disp: , Rfl:    spironolactone (ALDACTONE) 25 MG tablet, Take 25 mg by mouth daily. , Disp: , Rfl:  tretinoin (RETIN-A) 0.025 % cream, tretinoin 0.025 % topical cream, Disp: , Rfl:    triamcinolone ointment (KENALOG) 0.1 %, SMARTSIG:Sparingly Topical Twice a Week, Disp: , Rfl:    buPROPion (WELLBUTRIN XL) 150 MG 24 hr tablet, Take 3 tablets (450 mg total) by mouth daily., Disp: 270 tablet,  Rfl: 1   cetirizine (ZYRTEC) 10 MG tablet, Take 10 mg by mouth daily. (Patient not taking: Reported on 08/24/2021), Disp: , Rfl:    sertraline (ZOLOFT) 100 MG tablet, Take 1.5 tablets (150 mg total) by mouth daily., Disp: 135 tablet, Rfl: 1   traZODone (DESYREL) 100 MG tablet, Take 0.5-1 tablets (50-100 mg total) by mouth at bedtime as needed for sleep., Disp: 90 tablet, Rfl: 1 Medication Side Effects: none  Family Medical/ Social History: Changes? 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  General Appearance:  Unable to assess  Eye Contact:   Unable to assess  Speech:  Clear and Coherent and Normal Rate  Volume:  Normal  Mood:  Euthymic  Affect:   Unable to assess  Thought Process:  Goal Directed and Descriptions of Associations: Circumstantial  Orientation:  Full (Time, Place, and Person)  Thought Content: Logical   Suicidal Thoughts:  No  Homicidal Thoughts:  No  Memory:  WNL  Judgement:  Good  Insight:  Good  Psychomotor Activity:   Unable to assess  Concentration:  Concentration: Good and Attention Span: Good  Recall:  Good  Fund of Knowledge: Good  Language: Good  Assets:  Desire for Improvement  ADL's:  Intact  Cognition: WNL  Prognosis:  Good    DIAGNOSES:    ICD-10-CM   1. Recurrent major depression in full remission (HCC)  F33.42     2. Obsessive-compulsive disorder, unspecified type  F42.9     3. Insomnia, unspecified type  G47.00       Receiving Psychotherapy: No      RECOMMENDATIONS:  PDMP reviewed.  No results available.   I provided 25 minutes of non-face-to-face time during this encounter, including time spent before and after the visit in records review, medical decision making, counseling pertinent to today's visit, and charting.  She is doing well so no changes will be made.  Continue Wellbutrin XL 150 mg, 3  qd.  Continue Zoloft 100mg , 1.5 p.o. daily. Continue trazodone 100 mg, 1/2-1 p.o. nightly as needed sleep. Return in 6 months.  , PA-C

## 2021-09-22 IMAGING — MR MR LUMBAR SPINE W/O CM
4 of 5 series · 26 of 48 positions shown · non-contrast
Comparison: None.

CLINICAL DATA: Low back pain with unspecified laterality.

EXAM:
MRI LUMBAR SPINE WITHOUT CONTRAST
TECHNIQUE: Multiplanar, multisequence MR imaging of the lumbar spine was
performed. No intravenous contrast was administered.

[Series 3: T2 · sagittal · 4.0mm · 0.55mm/px · 6 of 15 slices shown (1 of 2)]
[im 1/15]
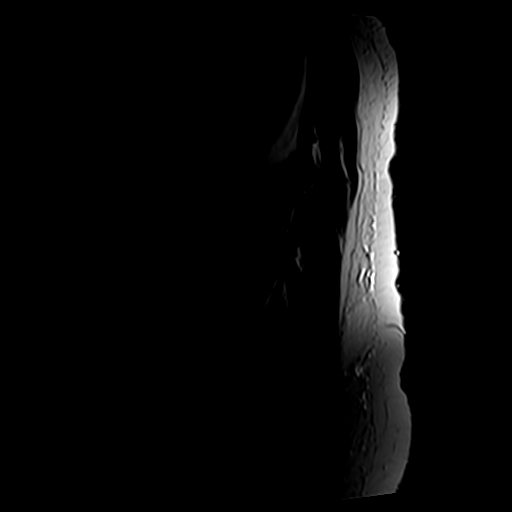
[im 3/15]
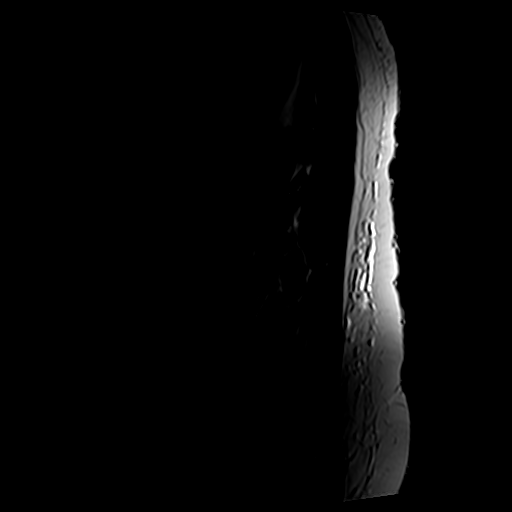
[im 6/15]
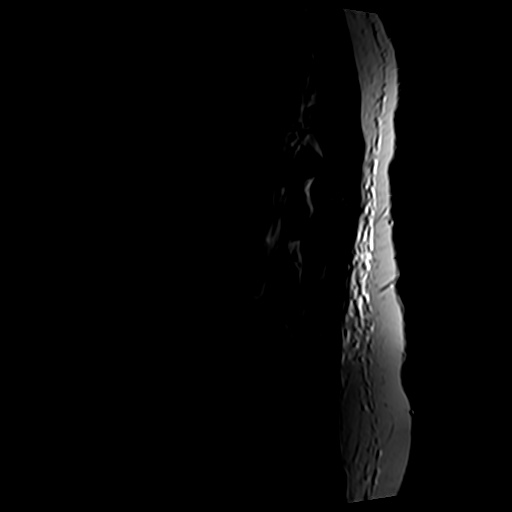
[im 9/15]
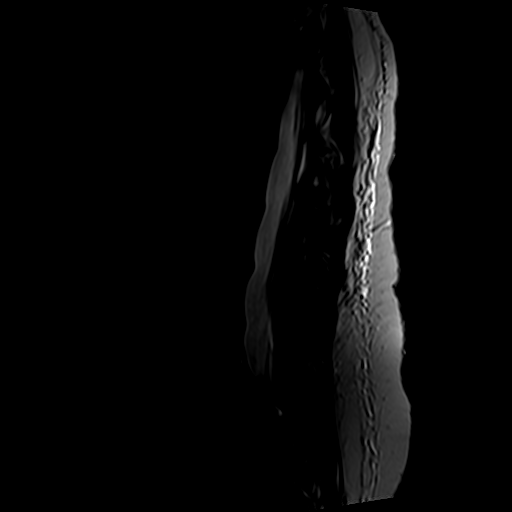
[im 12/15]
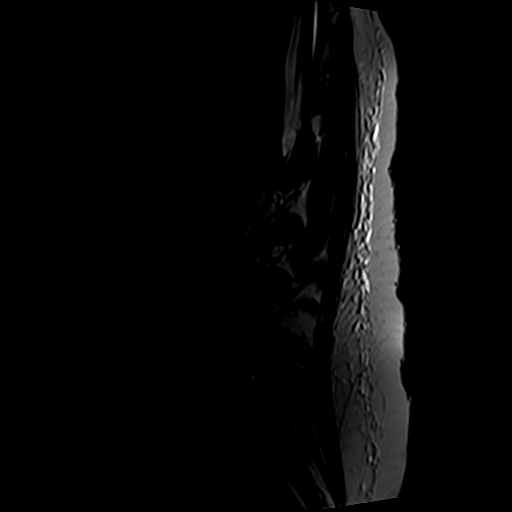
[im 15/15]
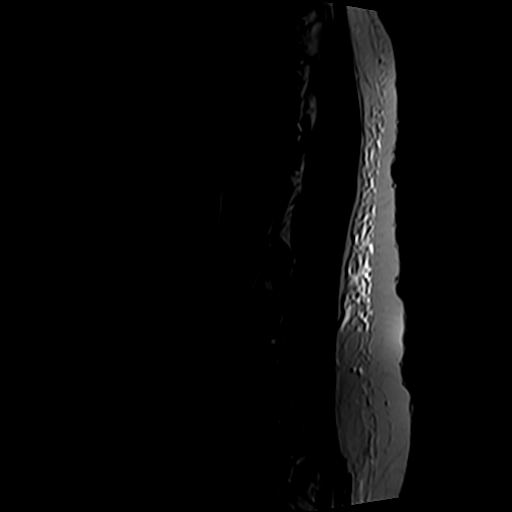

[Series 4: T1 · sagittal · 4.0mm · 0.55mm/px · 6 of 15 slices shown (1 of 2)]
[im 1/15]
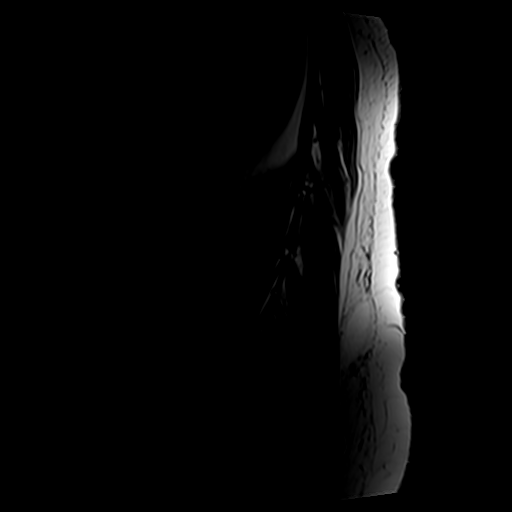
[im 3/15]
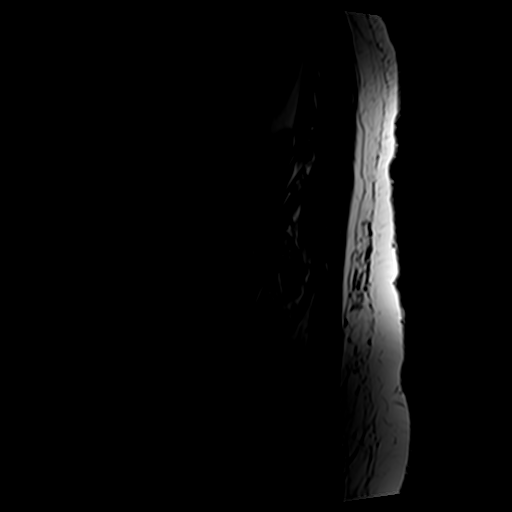
[im 6/15]
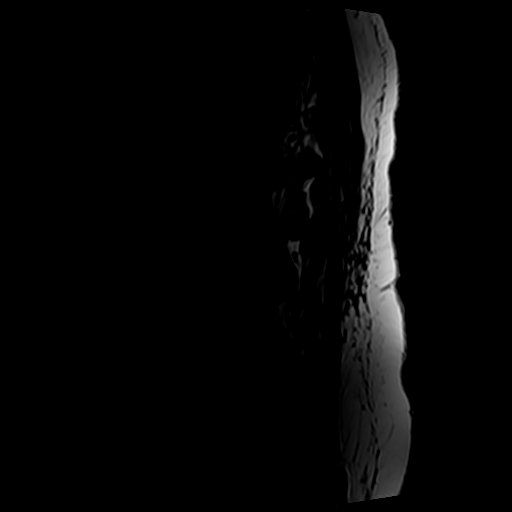
[im 9/15]
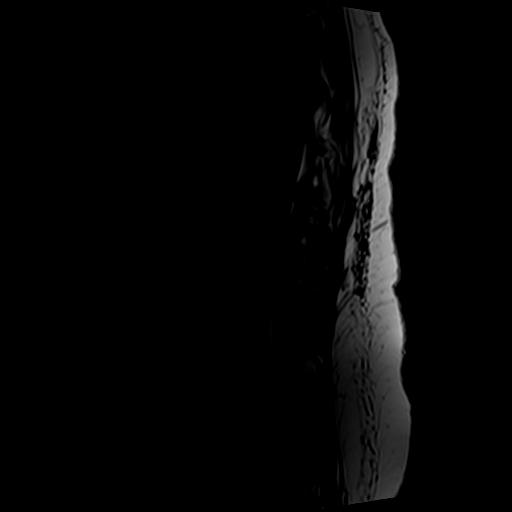
[im 12/15]
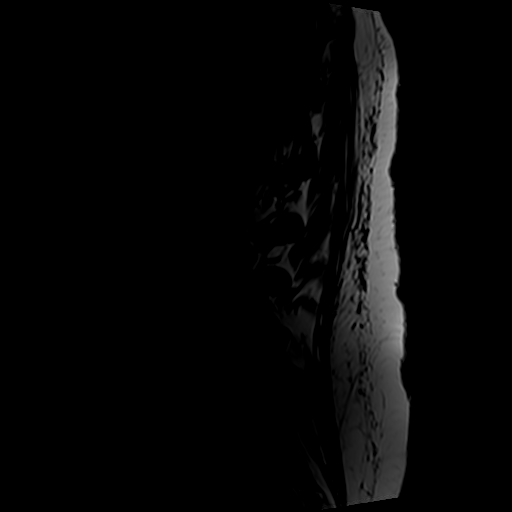
[im 15/15]
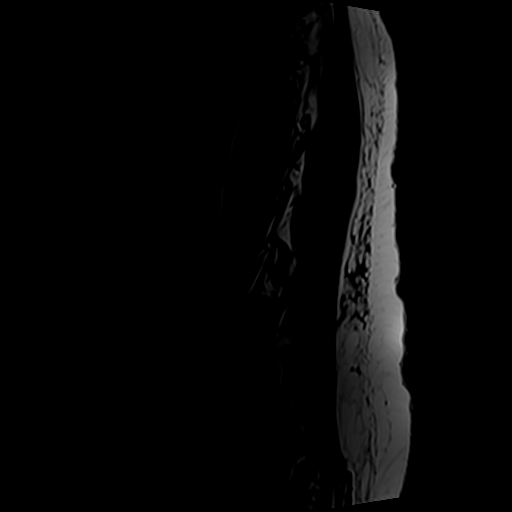

[Series 6: T2 · axial · 4.0mm · 0.70mm/px · z∈[-103,+102]mm · 9 of 38 slices shown (2 of 2)]
[im 1/38]
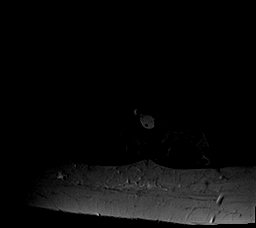
[im 6/38]
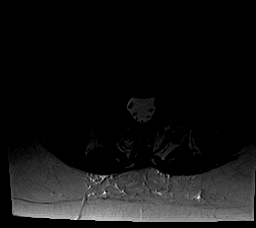
[im 11/38]
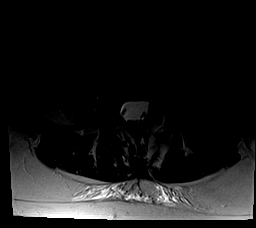
[im 16/38]
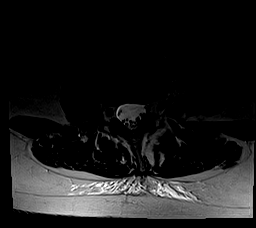
[im 19/38]
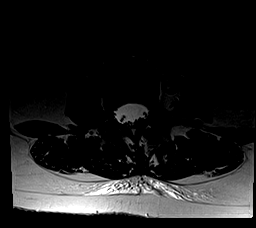
[im 22/38]
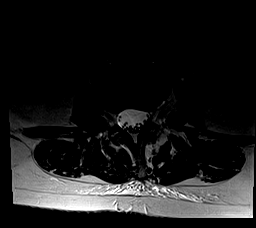
[im 27/38]
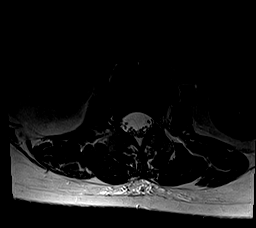
[im 32/38]
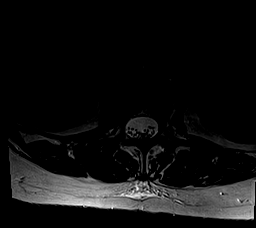
[im 38/38]
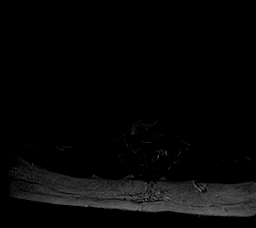

[Series 7: T1 · axial · 4.0mm · 0.35mm/px · z∈[-103,+72]mm · 5 of 38 slices shown (2 of 2)]
[im 1/38]
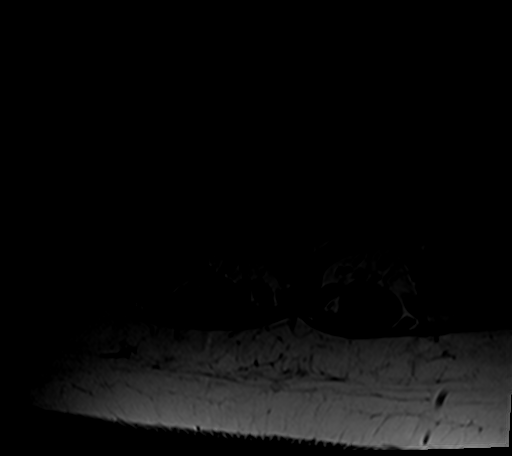
[im 6/38]
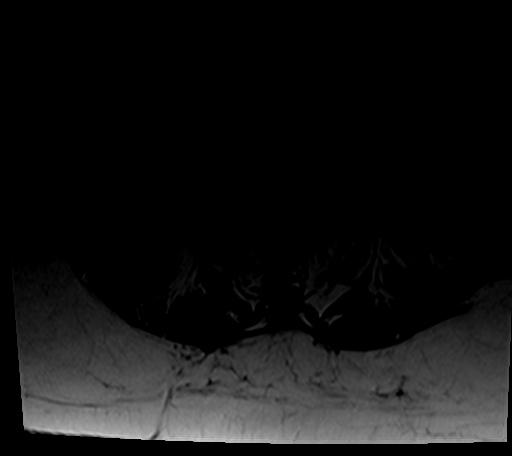
[im 11/38]
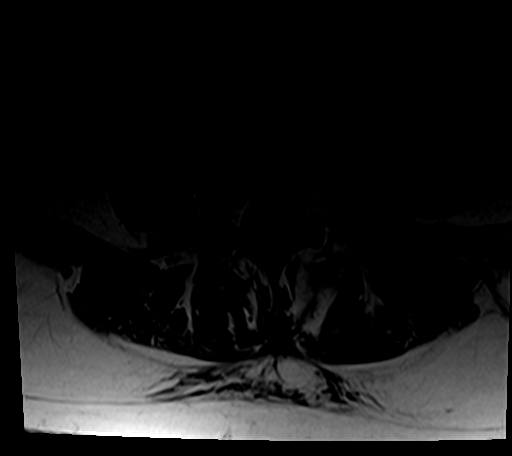
[im 19/38]
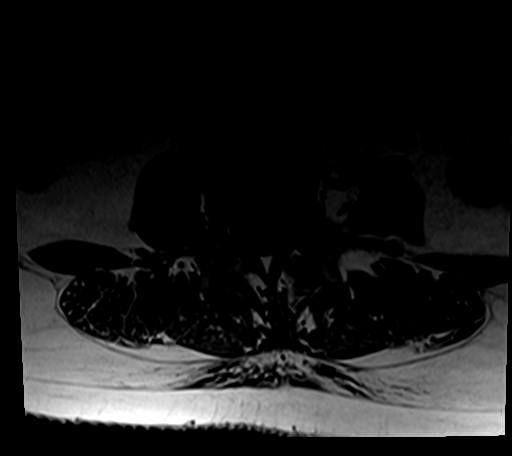
[im 32/38]
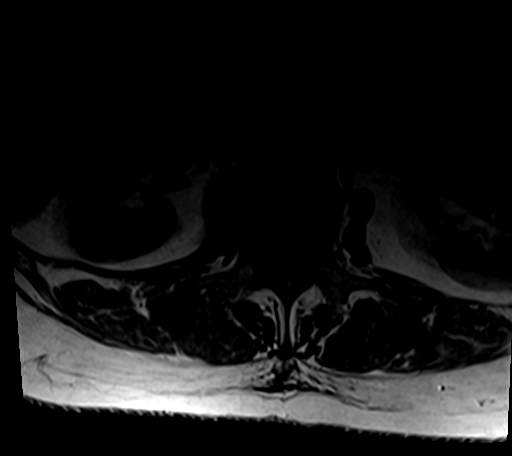

[26 of 48 positions shown; findings below may reference images not displayed]

FINDINGS: Segmentation:  Standard lumbar numbering

Alignment:  Dextroscoliosis and slight L3-4 retrolisthesis.

Vertebrae:  No fracture, evidence of discitis, or bone lesion.

Conus medullaris and cauda equina: Conus extends to the L1 level.
Conus and cauda equina appear normal.

Paraspinal and other soft tissues: Negative

Disc levels:

L3-4 mild disc narrowing asymmetric to the left, with mild bulging.
No impingement.

L4-5 mild disc narrowing and bulging. Mild degenerative facet
spurring.
IMPRESSION: Dextroscoliosis with early disc degeneration at L3-4 and L4-5. No
neural impingement.

## 2021-11-01 ENCOUNTER — Other Ambulatory Visit: Payer: Self-pay | Admitting: Orthopedic Surgery

## 2021-11-01 DIAGNOSIS — G8929 Other chronic pain: Secondary | ICD-10-CM

## 2021-11-26 ENCOUNTER — Ambulatory Visit
Admission: RE | Admit: 2021-11-26 | Discharge: 2021-11-26 | Disposition: A | Discharge status: Home / Self Care | Payer: BC Managed Care – PPO | Source: Ambulatory Visit | Attending: Orthopedic Surgery | Admitting: Orthopedic Surgery

## 2021-11-26 DIAGNOSIS — M545 Low back pain, unspecified: Secondary | ICD-10-CM

## 2021-11-26 MED ORDER — METHYLPREDNISOLONE ACETATE 40 MG/ML INJ SUSP (RADIOLOG
120.0000 mg | Freq: Once | INTRAMUSCULAR | Status: DC
Start: 2021-11-26 — End: 2021-11-27

## 2021-12-15 NOTE — Progress Notes (Signed)
Office Visit Note  Patient: Jessica Edwards             Date of Birth: [birth year]           MRN: [id number]             PCP: Juluis Rainier, MD (Inactive) Referring: Charna Elizabeth, MD Visit Date: 12/16/2021 Occupation: @GUAROCC @  Subjective:  Pain in joints.  History of Present Illness: Jessica Edwards is a 51 y.o. femalein consultation at the request of Dr. 40.  According the patient she developed pain and swelling in the right great toe in June 2023.  Patient recalls that she had a serving of pot roast  prior to this episode.  She tried to elevate her foot without much relief.  She had seen Dr. 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  She went to [facility] orth[facility] and was seen by [facility]. [facility] who thought that she had gout.  He referred her to her PCP.  Her PCP gave her colchicine which helped her symptoms.  At the time according to patient her uric acid level was 6.0.  She has not had recurrence of gout since then.  She was not placed on allopurinol.  She continues to have some discomfort in her right foot which Dr. Susa Simmonds felt was related to some dysfunction in the sesamoid bones per patient.  She has had lower back pain for the last few years.  She had MRI of her lumbar spine in the past which showed mild degenerative changes.  She has been also seeing Dr. Susa Simmonds for the lower back pain and the SI joint pain over the last 1 year.  She states she has had 2 cortisone injections to her left SI joint which gives relief.  Last cortisone injection was 2 weeks ago.  None of the other joints are painful.  There is no history of Planter fasciitis or Achilles tendinitis.  She has been having some discomfort in her right arm.  There is is no family history of gout.  She is gravida 0.  Activities of Daily Living:  Patient reports morning stiffness for 0 minutes.   Patient Reports nocturnal pain.  Difficulty dressing/grooming: Denies Difficulty climbing stairs:  Denies Difficulty getting out of chair: Denies Difficulty using hands for taps, buttons, cutlery, and/or writing: Denies  Review of Systems  Constitutional:  Positive for fatigue.  HENT:  Negative for mouth sores and mouth dryness.   Eyes:  Negative for dryness.  Respiratory:  Negative for shortness of breath.   Cardiovascular:  Negative for chest pain and palpitations.  Gastrointestinal:  Negative for blood in stool, constipation and diarrhea.  Endocrine: Positive for increased urination.  Genitourinary:  Negative for difficulty urinating.  Musculoskeletal:  Positive for joint pain, joint pain and muscle weakness. Negative for gait problem, joint swelling, myalgias, morning stiffness, muscle tenderness and myalgias.  Skin:  Positive for rash and sensitivity to sunlight. Negative for color change and hair loss.  Allergic/Immunologic: Positive for susceptible to infections.  Neurological:  Positive for headaches. Negative for dizziness.  Hematological:  Negative for swollen glands.  Psychiatric/Behavioral:  Positive for sleep disturbance. Negative for depressed mood. The patient is not nervous/anxious.     PMFS History:  Patient Active Problem List   Diagnosis Date Noted   Major depressive disorder, recurrent episode, moderate (HCC) 01/01/2018   Mixed obsessional thoughts and acts 01/01/2018   GAD (generalized anxiety disorder) 01/01/2018  Past Medical History:  Diagnosis Date   Gout    History of asthma    History of gastroesophageal reflux (GERD)    History of IBS    Kidney stones    Lichen sclerosus    Renal disorder    Vitamin D deficiency     Family History  Problem Relation Age of Onset   Dementia Mother        Died 11-17-20.   Skin cancer Mother    Heart disease Father    Atrial fibrillation Father    Other Father        4 hip replacements, rotator cuff repair, reverse shoulder replacement   Esophageal cancer Father    Past Surgical History:  Procedure  Laterality Date   KIDNEY STONE SURGERY     1993, 2006   Social History   Social History Narrative   Not on file   Immunization History  Administered Date(s) Administered   Tdap 01/22/2018     Objective: Vital Signs: BP 123/82 (BP Location: Right Arm, Patient Position: Sitting, Cuff Size: Normal)   Pulse 68   Resp 17   Ht 5' 5.5" (1.664 m)   Wt 194 lb 6.4 oz (88.2 kg)   BMI 31.86 kg/m    Physical Exam Vitals and nursing note reviewed.  Constitutional:      Appearance: She is well-developed.  HENT:     Head: Normocephalic and atraumatic.  Eyes:     Conjunctiva/sclera: Conjunctivae normal.  Cardiovascular:     Rate and Rhythm: Normal rate and regular rhythm.     Heart sounds: Normal heart sounds.  Pulmonary:     Effort: Pulmonary effort is normal.     Breath sounds: Normal breath sounds.  Abdominal:     General: Bowel sounds are normal.     Palpations: Abdomen is soft.  Musculoskeletal:     Cervical back: Normal range of motion.  Lymphadenopathy:     Cervical: No cervical adenopathy.  Skin:    General: Skin is warm and dry.     Capillary Refill: Capillary refill takes less than 2 seconds.  Neurological:     Mental Status: She is alert and oriented to person, place, and time.  Psychiatric:        Behavior: Behavior normal.      Musculoskeletal Exam: Cervical, thoracic and lumbar spine were in good range of motion.  She had no SI joint tenderness.  Shoulder joints, elbow joints, wrist joints, MCPs PIPs and DIPs been good range of motion with no synovitis.  Hip joints, knee joints, ankles, MTPs and PIPs been good range of motion with no synovitis.  CDAI Exam: CDAI Score: -- Patient Global: --; Provider Global: -- Swollen: --; Tender: -- Joint Exam 12/16/2021   No joint exam has been documented for this visit   There is currently no information documented on the homunculus. Go to the Rheumatology activity and complete the homunculus joint  exam.  Investigation: No additional findings.  Imaging: CT BIOPSY  Result Date: 11/26/2021 CLINICAL DATA:  Chronic left sacroiliac joint pain. Good response to left SI joint injection in April. Symptoms have recurred. EXAM: CT-GUIDED LEFT SACROILIAC JOINT INJECTION PROCEDURE: After a thorough discussion of risks and benefits of the procedure, including bleeding, infection, injury to nerves, blood vessels, and adjacent structures, verbal and written consent was obtained. The patient was placed prone on the CT table and localization was performed over the sacrum. Target site was marked using CT guidance. The skin  was prepped and draped in the usual sterile fashion using Betadine soap. After local anesthesia with 1% lidocaine without epinephrine and subsequent deep anesthesia, a 5 inch 22 gauge spinal needle was advanced into the left SI joint. Injection of 0.5 ml Isovue-M 200 confirmed intra-articular placement. No vascular uptake present. Subsequently, 80 mg of Depo-Medrol 1 mL of 0.25% bupivacaine were injected into the left SI joint. Needle was removed and a sterile dressing applied. No complications were observed. The patient was observed and released under the care of a driver after 30 minutes. IMPRESSION: Successful CT guided left SI joint steroid injection. Electronically Signed   By: Titus Dubin M.D.   On: 11/26/2021 12:16    Recent Labs: No results found for: "WBC", "HGB", "PLT", "NA", "K", "CL", "CO2", "GLUCOSE", "BUN", "CREATININE", "BILITOT", "ALKPHOS", "AST", "ALT", "PROT", "ALBUMIN", "CALCIUM", "GFRAA", "QFTBGOLD", "QFTBGOLDPLUS"  Speciality Comments: No specialty comments available.  Procedures:  No procedures performed Allergies: Augmentin [amoxicillin-pot clavulanate], Codeine, and Doxycycline   Assessment / Plan:     Visit Diagnoses: Idiopathic chronic gout of multiple sites without tophus -patient reports that she developed a flare of gout in June 2023 in her right great toe.   She was evaluated by Dr. Lucia Gaskins and later by her PCP.  She was treated with colchicine.  She states colchicine resolved her symptoms.  She had no recurrence of symptoms since then.  She continues to have some discomfort in her feet.  She has been told that she has early osteoarthritis in her feet.  She had x-rays by Dr. Lucia Gaskins patient bring at the follow-up visit.  She recalls having pot roast prior to having this episode.  She states her uric acid at her PCPs office was 6.0.  There is no family history of gout.  I did detailed discussion with the patient.  She was given a clinical diagnosis of gout.  We will check uric acid today while she was not having a flare as uric acid could be low at the time of a flare.  I advised her to contact me if she develops another episode.  She has colchicine at home which she can use on a as needed basis for the gout..  Low purine diet was discussed.  Handout was given.  Patient does not drink alcohol.  Pain in both feet -she gives history of pain in her bilateral feet.  She states she has been told that she has osteoarthritis she will bring x-rays at the follow-up visit..  I will check her labs today.  Plan: Sedimentation rate, Uric acid, Rheumatoid factor, Cyclic citrul peptide antibody, IgG  Medication management - Plan: CBC with Differential/Platelet, COMPLETE METABOLIC PANEL WITH GFR  Sacroiliitis, not elsewhere classified (HCC)-patient gives history of intermittent left SI joint pain for the last 1 year.  She has had cortisone injections x2 by Dr. Lynann Bologna.  She states the last injection was 2 weeks ago which gave her relief.  DDD (degenerative disc disease), lumbar -she is phonic lower back pain.  I reviewed her previous MRI.  MRI lumbar 07/13/19: dextroscoliosis with early disc degeneration at L3-L4 and L4-L5.  Core strength exercises were discussed.  Nephrolithiasis-patient states that she was told she had calcium stones in the past.  Other medical problems are  listed as follows:  Lichen sclerosus  History of IBS  History of gastroesophageal reflux (GERD)-she states she had reflux symptoms which were severe and improved after being on pantoprazole.  History of asthma  Vitamin D deficiency  Major  depressive disorder, recurrent episode, moderate (HCC)-she is on Wellbutrin and Zoloft.  GAD (generalized anxiety disorder)  Mixed obsessional thoughts and acts  Orders: Orders Placed This Encounter  Procedures   CBC with Differential/Platelet   COMPLETE METABOLIC PANEL WITH GFR   Sedimentation rate   Uric acid   Rheumatoid factor   Cyclic citrul peptide antibody, IgG   No orders of the defined types were placed in this encounter.    Follow-Up Instructions: Return for Gout.   Pollyann Savoy, MD  Note - This record has been created using Animal nutritionist.  Chart creation errors have been sought, but may not always  have been located. Such creation errors do not reflect on  the standard of medical care.

## 2021-12-16 ENCOUNTER — Ambulatory Visit: Payer: BC Managed Care – PPO | Attending: Rheumatology | Admitting: Rheumatology

## 2021-12-16 ENCOUNTER — Encounter: Payer: Self-pay | Admitting: Rheumatology

## 2021-12-16 VITALS — BP 123/82 | HR 68 | Resp 17 | Ht 65.5 in | Wt 194.4 lb

## 2021-12-16 DIAGNOSIS — Z79899 Other long term (current) drug therapy: Secondary | ICD-10-CM

## 2021-12-16 DIAGNOSIS — N200 Calculus of kidney: Secondary | ICD-10-CM

## 2021-12-16 DIAGNOSIS — Z8709 Personal history of other diseases of the respiratory system: Secondary | ICD-10-CM

## 2021-12-16 DIAGNOSIS — F331 Major depressive disorder, recurrent, moderate: Secondary | ICD-10-CM

## 2021-12-16 DIAGNOSIS — F411 Generalized anxiety disorder: Secondary | ICD-10-CM

## 2021-12-16 DIAGNOSIS — M51369 Other intervertebral disc degeneration, lumbar region without mention of lumbar back pain or lower extremity pain: Secondary | ICD-10-CM

## 2021-12-16 DIAGNOSIS — M79671 Pain in right foot: Secondary | ICD-10-CM

## 2021-12-16 DIAGNOSIS — M461 Sacroiliitis, not elsewhere classified: Secondary | ICD-10-CM

## 2021-12-16 DIAGNOSIS — L900 Lichen sclerosus et atrophicus: Secondary | ICD-10-CM

## 2021-12-16 DIAGNOSIS — Z8719 Personal history of other diseases of the digestive system: Secondary | ICD-10-CM

## 2021-12-16 DIAGNOSIS — E559 Vitamin D deficiency, unspecified: Secondary | ICD-10-CM

## 2021-12-16 DIAGNOSIS — M5136 Other intervertebral disc degeneration, lumbar region: Secondary | ICD-10-CM

## 2021-12-16 DIAGNOSIS — M1A09X0 Idiopathic chronic gout, multiple sites, without tophus (tophi): Secondary | ICD-10-CM

## 2021-12-16 DIAGNOSIS — F422 Mixed obsessional thoughts and acts: Secondary | ICD-10-CM

## 2021-12-16 DIAGNOSIS — M79672 Pain in left foot: Secondary | ICD-10-CM

## 2021-12-16 NOTE — Patient Instructions (Signed)
Low-Purine Eating Plan A low-purine eating plan involves making food choices to limit your purine intake. Purine is a kind of uric acid. Too much uric acid in your blood can cause certain conditions, such as gout and kidney stones. Eating a low-purine diet may help control these conditions. What are tips for following this plan? Shopping Avoid buying products that contain high-fructose corn syrup. Check for this on food labels. It is commonly found in many processed foods and soft drinks. Be sure to check for it in baked goods such as cookies, canned fruits, and cereals and cereal bars. Avoid buying veal, chicken breast with skin, lamb, and organ meats such as liver. These types of meats tend to have the highest purine content. Choose dairy products. These may lower uric acid levels. Avoid certain types of fish. Not all fish and seafood have high purine content. Examples with high purine content include anchovies, trout, tuna, sardines, and salmon. Avoid buying beverages that contain alcohol, particularly beer and hard liquor. Alcohol can affect the way your body gets rid of uric acid. Meal planning  Learn which foods do or do not affect you. If you find out that a food tends to cause your gout symptoms to flare up, avoid eating that food. You can enjoy foods that do not cause problems. If you have any questions about a food item, talk with your dietitian or health care provider. Reduce the overall amount of meat in your diet. When you do eat meat, choose ones with lower purine content. Include plenty of fruits and vegetables. Although some vegetables may have a high purine content--such as asparagus, mushrooms, spinach, or cauliflower--it has been shown that these do not contribute to uric acid blood levels as much. Consume at least 1 dairy serving a day. This has been shown to decrease uric acid levels. General information If you drink alcohol: Limit how much you have to: 0-1 drink a day for  women who are not pregnant. 0-2 drinks a day for men. Know how much alcohol is in a drink. In the [place] one drink equals one 12 oz bottle of beer (355 mL), one 5 oz glass of wine (148 mL), or one 1 oz glass of hard liquor (44 mL). Drink plenty of water. Try to drink enough to keep your urine pale yellow. Fluids can help remove uric acid from your body. Work with your health care provider and dietitian to develop a plan to achieve or maintain a healthy weight. Losing weight may help reduce uric acid in your blood. What foods are recommended? The following are some types of foods that are good choices when limiting purine intake: Fresh or frozen fruits and vegetables. Whole grains, breads, cereals, and pasta. Rice. Beans, peas, legumes. Nuts and seeds. Dairy products. Fats and oils. The items listed above may not be a complete list. Talk with a dietitian about what dietary choices are best for you. What foods are not recommended? Limit your intake of foods high in purines, including: Beer and other alcohol. Meat-based gravy or sauce. Canned or fresh fish, such as: Anchovies, sardines, herring, salmon, and tuna. Mussels and scallops. Codfish, trout, and haddock. Bacon, veal, chicken breast with skin, and lamb. Organ meats, such as: Liver or kidney. Tripe. Sweetbreads (thymus gland or pancreas). Wild game or goose. Yeast or yeast extract supplements. Drinks sweetened with high-fructose corn syrup, such as soda. Processed foods made with high-fructose corn syrup. The items listed above may not be a complete list of foods   and beverages you should limit. Contact a dietitian for more information. Summary Eating a low-purine diet may help control conditions caused by too much uric acid in the body, such as gout or kidney stones. Choose low-purine foods, limit alcohol, and limit high-fructose corn syrup. You will learn over time which foods do or do not affect you. If you find out that a  food tends to cause your gout symptoms to flare up, avoid eating that food. This information is not intended to replace advice given to you by your health care provider. Make sure you discuss any questions you have with your health care provider. Document Revised: 02/04/2021 Document Reviewed: 02/04/2021 Elsevier Patient Education  2023 Elsevier Inc.  Gout  Gout is painful swelling of your joints. Gout is a type of arthritis. It is caused by having too much uric acid in your body. Uric acid is a chemical that is made when your body breaks down substances called purines. If your body has too much uric acid, sharp crystals can form and build up in your joints. This causes pain and swelling. Gout attacks can happen quickly and be very painful (acute gout). Over time, the attacks can affect more joints and happen more often (chronic gout). What are the causes? Gout is caused by too much uric acid in your blood. This can happen because: Your kidneys do not remove enough uric acid from your blood. Your body makes too much uric acid. You eat too many foods that are high in purines. These foods include organ meats, some seafood, and beer. Trauma or stress can bring on an attack. What increases the risk? Having a family history of gout. Being female and middle-aged. Being female and having gone through menopause. Having an organ transplant. Taking certain medicines. Having certain conditions, such as: Being very overweight (obese). Lead poisoning. Kidney disease. A skin condition called psoriasis. Other risks include: Losing weight too quickly. Not having enough water in the body (being dehydrated). Drinking alcohol, especially beer. Drinking beverages that are sweetened with a type of sugar called fructose. What are the signs or symptoms? An attack of acute gout often starts at night and usually happens in just one joint. The most common place is the big toe. Other joints that may be affected  include joints of the feet, ankle, knee, fingers, wrist, or elbow. Symptoms may include: Very bad pain. Warmth. Swelling. Stiffness. Tenderness. The affected joint may be very painful to touch. Shiny, red, or purple skin. Chills and fever. Chronic gout may cause symptoms more often. More joints may be involved. You may also have white or yellow lumps (tophi) on your hands or feet or in other areas near your joints. How is this treated? Treatment for an acute attack may include medicines for pain and swelling, such as: NSAIDs, such as ibuprofen. Steroids taken by mouth or injected into a joint. Colchicine. This can be given by mouth or through an IV tube. Treatment to prevent future attacks may include: Taking small doses of NSAIDs or colchicine daily. Using a medicine that reduces uric acid levels in your blood, such as allopurinol. Making changes to your diet. You may need to see a food expert (dietitian) about what to eat and drink to prevent gout. Follow these instructions at home: During a gout attack  If told, put ice on the painful area. To do this: Put ice in a plastic bag. Place a towel between your skin and the bag. Leave the ice on for 20   minutes, 2-3 times a day. Take off the ice if your skin turns bright red. This is very important. If you cannot feel pain, heat, or cold, you have a greater risk of damage to the area. Raise the painful joint above the level of your heart as often as you can. Rest the joint as much as possible. If the joint is in your leg, you may be given crutches. Follow instructions from your doctor about what you cannot eat or drink. Avoiding future gout attacks Eat a low-purine diet. Avoid foods and drinks such as: Liver. Kidney. Anchovies. Asparagus. Herring. Mushrooms. Mussels. Beer. Stay at a healthy weight. If you want to lose weight, talk with your doctor. Do not lose weight too fast. Start or continue an exercise plan as told by your  doctor. Eating and drinking Avoid drinks sweetened by fructose. Drink enough fluids to keep your pee (urine) pale yellow. If you drink alcohol: Limit how much you have to: 0-1 drink a day for women who are not pregnant. 0-2 drinks a day for men. Know how much alcohol is in a drink. In the [place] one drink equals one 12 oz bottle of beer (355 mL), one 5 oz glass of wine (148 mL), or one 1 oz glass of hard liquor (44 mL). General instructions Take over-the-counter and prescription medicines only as told by your doctor. Ask your doctor if you should avoid driving or using machines while you are taking your medicine. Return to your normal activities when your doctor says that it is safe. Keep all follow-up visits. Where to find more information National Institutes of Health: www.niams.nih.gov Contact a doctor if: You have another gout attack. You still have symptoms of a gout attack after 10 days of treatment. You have problems (side effects) because of your medicines. You have chills or a fever. You have burning pain when you pee (urinate). You have pain in your lower back or belly. Get help right away if: You have very bad pain. Your pain cannot be controlled. You cannot pee. Summary Gout is painful swelling of the joints. The most common site of pain is the big toe, but it can affect other joints. Medicines and avoiding some foods can help to prevent and treat gout attacks. This information is not intended to replace advice given to you by your health care provider. Make sure you discuss any questions you have with your health care provider. Document Revised: 11/25/2020 Document Reviewed: 11/25/2020 Elsevier Patient Education  2023 Elsevier Inc.   

## 2021-12-18 LAB — SEDIMENTATION RATE: Sed Rate: 29 mm/h (ref 0–30)

## 2021-12-18 LAB — COMPLETE METABOLIC PANEL WITH GFR
AG Ratio: 1.5 (calc) (ref 1.0–2.5)
ALT: 17 U/L (ref 6–29)
AST: 15 U/L (ref 10–35)
Albumin: 4.3 g/dL (ref 3.6–5.1)
Alkaline phosphatase (APISO): 99 U/L (ref 37–153)
BUN: 23 mg/dL (ref 7–25)
CO2: 29 mmol/L (ref 20–32)
Calcium: 9.3 mg/dL (ref 8.6–10.4)
Chloride: 102 mmol/L (ref 98–110)
Creat: 0.94 mg/dL (ref 0.50–1.03)
Globulin: 2.9 g/dL (calc) (ref 1.9–3.7)
Glucose, Bld: 87 mg/dL (ref 65–99)
Potassium: 4.2 mmol/L (ref 3.5–5.3)
Sodium: 141 mmol/L (ref 135–146)
Total Bilirubin: 0.4 mg/dL (ref 0.2–1.2)
Total Protein: 7.2 g/dL (ref 6.1–8.1)
eGFR: 73 mL/min/{1.73_m2} (ref >=60)

## 2021-12-18 LAB — CBC WITH DIFFERENTIAL/PLATELET
Absolute Monocytes: 631 cells/uL (ref 200–950)
Basophils Absolute: 58 cells/uL (ref 0–200)
Basophils Relative: 0.7 %
Eosinophils Absolute: 125 cells/uL (ref 15–500)
Eosinophils Relative: 1.5 %
HCT: 41 % (ref 35.0–45.0)
Hemoglobin: 13.6 g/dL (ref 11.7–15.5)
Lymphs Abs: 1428 cells/uL (ref 850–3900)
MCH: 27.9 pg (ref 27.0–33.0)
MCHC: 33.2 g/dL (ref 32.0–36.0)
MCV: 84 fL (ref 80.0–100.0)
MPV: 10.9 fL (ref 7.5–12.5)
Monocytes Relative: 7.6 %
Neutro Abs: 6059 cells/uL (ref 1500–7800)
Neutrophils Relative %: 73 %
Platelets: 201 10*3/uL (ref 140–400)
RBC: 4.88 10*6/uL (ref 3.80–5.10)
RDW: 12.4 % (ref 11.0–15.0)
Total Lymphocyte: 17.2 %
WBC: 8.3 10*3/uL (ref 3.8–10.8)

## 2021-12-18 LAB — RHEUMATOID FACTOR: Rheumatoid fact SerPl-aCnc: <14 IU/mL (ref <14)

## 2021-12-18 LAB — URIC ACID: Uric Acid, Serum: 6.1 mg/dL (ref 2.5–7.0)

## 2021-12-18 LAB — CYCLIC CITRUL PEPTIDE ANTIBODY, IGG: Cyclic Citrullin Peptide Ab: <16 UNITS

## 2021-12-19 NOTE — Progress Notes (Signed)
All the lab results are within normal limits.  Uric acid is not high.  We can discuss prophylactic treatment at the follow-up visit (which is not scheduled) or she can return on a as needed basis.

## 2022-02-24 ENCOUNTER — Encounter: Payer: Self-pay | Admitting: Physician Assistant

## 2022-02-24 ENCOUNTER — Ambulatory Visit: Payer: BC Managed Care – PPO | Admitting: Physician Assistant

## 2022-02-24 DIAGNOSIS — F3342 Major depressive disorder, recurrent, in full remission: Secondary | ICD-10-CM

## 2022-02-24 DIAGNOSIS — G4700 Insomnia, unspecified: Secondary | ICD-10-CM | POA: Diagnosis not present

## 2022-02-24 MED ORDER — SERTRALINE HCL 100 MG PO TABS: 150.0000 mg | ORAL_TABLET | Freq: Every day | ORAL | 1 refills | Status: DC

## 2022-02-24 MED ORDER — TRAZODONE HCL 100 MG PO TABS: 50.0000 mg | ORAL_TABLET | Freq: Every evening | ORAL | 1 refills | Status: DC | PRN

## 2022-02-24 MED ORDER — BUPROPION HCL ER (XL) 150 MG PO TB24: 450.0000 mg | ORAL_TABLET | Freq: Every day | ORAL | 1 refills | Status: DC

## 2022-02-24 NOTE — Progress Notes (Signed)
[facility]  Patient ID: Jessica Edwards,  MRN: [id number]  PCP: Juluis Rainier, MD (Inactive)  Date of Evaluation: 02/24/2022 Time spent:20 minutes  Chief Complaint:  Chief Complaint   Anxiety; Depression; Insomnia; Follow-up    HISTORY/CURRENT STATUS: For routine med check.  Has her own turtoring business. Has 3 kids now. Her dad is doing well, getting around with an upright walker.   Patient is able to enjoy things.  Energy and motivation are good.  No extreme sadness, tearfulness, or feelings of hopelessness.  Doesn't sleep well lately.  Trouble falling asleep, not staying asleep. Her dog kept her up some last night. ADLs and personal hygiene are normal.   Denies any changes in concentration, making decisions, or remembering things.  Appetite has not changed.  Weight is stable. Not having anxiety. Denies suicidal or homicidal thoughts.  Patient denies increased energy with decreased need for sleep, increased talkativeness, racing thoughts, impulsivity or risky behaviors, increased spending, increased libido, grandiosity, increased irritability or anger, paranoia, or hallucinations.  Denies dizziness, syncope, seizures, numbness, tingling, tremor, tics, unsteady gait, slurred speech, confusion. Denies muscle or joint pain, stiffness, or dystonia.  Individual Medical History/ Review of Systems: Changes? :No     Past medications for mental health diagnoses include: Cymbalta, Xanax, Zoloft  Allergies: Augmentin [amoxicillin-pot clavulanate], Codeine, and Doxycycline  Current Medications:  Current Outpatient Medications:    cetirizine (ZYRTEC) 10 MG tablet, Take 10 mg by mouth daily., Disp: , Rfl:    Clindamycin Phosphate foam, clindamycin 1 % topical foam, Disp: , Rfl:    clobetasol ointment (TEMOVATE) 0.05 %, clobetasol 0.05 % topical ointment, Disp: , Rfl:    Dapsone 5 % topical gel, Aczone 5 % topical gel, Disp: , Rfl:    Estradiol (VAGIFEM) 10 MCG TABS  vaginal tablet, Vagifem 10 mcg vaginal tablet  INSERT 1 TABLET TWICE A WEEK BY VAGINAL ROUTE FOR 14 DAYS., Disp: , Rfl:    fluticasone (FLONASE) 50 MCG/ACT nasal spray, Place into both nostrils daily., Disp: , Rfl:    Pantoprazole Sodium (PROTONIX PO), Take 40 mg by mouth daily., Disp: , Rfl:    spironolactone (ALDACTONE) 25 MG tablet, Take 25 mg by mouth daily., Disp: , Rfl:    tretinoin (RETIN-A) 0.025 % cream, tretinoin 0.025 % topical cream, Disp: , Rfl:    triamcinolone ointment (KENALOG) 0.1 %, SMARTSIG:Sparingly Topical Twice a Week, Disp: , Rfl:    buPROPion (WELLBUTRIN XL) 150 MG 24 hr tablet, Take 3 tablets (450 mg total) by mouth daily., Disp: 270 tablet, Rfl: 1   sertraline (ZOLOFT) 100 MG tablet, Take 1.5 tablets (150 mg total) by mouth daily., Disp: 135 tablet, Rfl: 1   traZODone (DESYREL) 100 MG tablet, Take 0.5-1 tablets (50-100 mg total) by mouth at bedtime as needed for sleep., Disp: 90 tablet, Rfl: 1 Medication Side Effects: none  Family Medical/ Social History: Changes? No  MENTAL HEALTH EXAM:  There were no vitals taken for this visit.There is no height or weight on file to calculate BMI.  General Appearance: Casual and Well Groomed  Eye Contact:  Good  Speech:  Clear and Coherent and Normal Rate  Volume:  Normal  Mood:  Euthymic  Affect:  Congruent  Thought Process:  Goal Directed and Descriptions of Associations: Circumstantial  Orientation:  Full (Time, Place, and Person)  Thought Content: Logical   Suicidal Thoughts:  No  Homicidal Thoughts:  No  Memory:  WNL  Judgement:  Good  Insight:  Good  Psychomotor  Activity:  Normal  Concentration:  Concentration: Good and Attention Span: Good  Recall:  Good  Fund of Knowledge: Good  Language: Good  Assets:  Desire for Improvement  ADL's:  Intact  Cognition: WNL  Prognosis:  Good   DIAGNOSES:    ICD-10-CM   1. Recurrent major depression in full remission (HCC)  F33.42     2. Insomnia, unspecified type   G47.00       Receiving Psychotherapy: No     RECOMMENDATIONS:  PDMP reviewed.  No results available.   I provided 20 minutes of non-face-to-face time during this encounter, including time spent before and after the visit in records review, medical decision making, counseling pertinent to today's visit, and charting.  She is doing well so no changes will be made.  Continue Wellbutrin XL 150 mg, 3 qd.  Continue Zoloft 100mg , 1.5 p.o. daily. Continue trazodone 100 mg, 1/2-1 p.o. nightly as needed sleep. Return in 6 months.  , PA-C

## 2022-04-05 ENCOUNTER — Other Ambulatory Visit (HOSPITAL_COMMUNITY): Payer: Self-pay | Admitting: Gastroenterology

## 2022-04-05 DIAGNOSIS — R1011 Right upper quadrant pain: Secondary | ICD-10-CM

## 2022-04-12 ENCOUNTER — Other Ambulatory Visit (HOSPITAL_COMMUNITY): Payer: Self-pay | Admitting: Gastroenterology

## 2022-04-12 DIAGNOSIS — R1011 Right upper quadrant pain: Secondary | ICD-10-CM

## 2022-04-21 ENCOUNTER — Ambulatory Visit (HOSPITAL_COMMUNITY)
Admission: RE | Admit: 2022-04-21 | Discharge: 2022-04-21 | Disposition: A | Discharge status: Home / Self Care | Payer: BC Managed Care – PPO | Source: Ambulatory Visit | Attending: Gastroenterology | Admitting: Gastroenterology

## 2022-04-21 DIAGNOSIS — R1011 Right upper quadrant pain: Secondary | ICD-10-CM | POA: Insufficient documentation

## 2022-04-22 ENCOUNTER — Encounter (HOSPITAL_COMMUNITY)
Admission: RE | Admit: 2022-04-22 | Discharge: 2022-04-22 | Disposition: A | Discharge status: Home / Self Care | Payer: BC Managed Care – PPO | Source: Ambulatory Visit | Attending: Gastroenterology | Admitting: Gastroenterology

## 2022-04-22 DIAGNOSIS — R1011 Right upper quadrant pain: Secondary | ICD-10-CM | POA: Diagnosis present

## 2022-04-22 MED ORDER — TECHNETIUM TC 99M MEBROFENIN IV KIT
5.4800 | PACK | Freq: Once | INTRAVENOUS | Status: AC
  Administered 2022-04-22: 5.48 via INTRAVENOUS

## 2022-08-25 ENCOUNTER — Ambulatory Visit: Payer: BC Managed Care – PPO | Admitting: Physician Assistant

## 2022-08-25 ENCOUNTER — Encounter: Payer: Self-pay | Admitting: Physician Assistant

## 2022-08-25 DIAGNOSIS — F429 Obsessive-compulsive disorder, unspecified: Secondary | ICD-10-CM | POA: Diagnosis not present

## 2022-08-25 DIAGNOSIS — F411 Generalized anxiety disorder: Secondary | ICD-10-CM | POA: Diagnosis not present

## 2022-08-25 NOTE — Progress Notes (Signed)
[facility]  Patient ID: Jessica Edwards,  MRN: [id number]  PCP: Aliene Beams, MD  Date of Evaluation: 08/25/2022 Time spent: 19 mins  Chief Complaint:  Chief Complaint   Anxiety; Depression; Follow-up    HISTORY/CURRENT STATUS: For routine med check.  She went off Wellbutrin 3 months ago, didn't want to take it and she was not sure she needed it anymore.  Has been off the Zoloft for 3 weeks, only b/c she had to pack up everything b/c her floor flooded and she could not find it, her dad was in the hospital, her [facility] went out, she had to get a new hot water heater. But she feels like her mood is good and she doesn't want to go back on medications if she does not have to.  Not obsessing or having compulsions. No PA. She's exercising regularly with a Systems analyst.  Patient is able to enjoy things.  Energy and motivation are good. 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  No extreme sadness, tearfulness, or feelings of hopelessness.  ADLs and personal hygiene are normal.   Denies any changes in concentration, making decisions, or remembering things.  Appetite has not changed.  Weight is stable.  Denies suicidal or homicidal thoughts.  Patient denies increased energy with decreased need for sleep, increased talkativeness, racing thoughts, impulsivity or risky behaviors, increased spending, increased libido, grandiosity, increased irritability or anger, paranoia, or hallucinations.  Denies dizziness, syncope, seizures, numbness, tingling, tremor, tics, unsteady gait, slurred speech, confusion. Denies muscle or joint pain, stiffness, or dystonia.  Individual Medical History/ Review of Systems: Changes? :No     Past medications for mental health diagnoses include: Cymbalta, Xanax, Zoloft  Allergies: Augmentin [amoxicillin-pot clavulanate], Codeine, and Doxycycline  Current Medications:  Current Outpatient Medications:    Clindamycin  Phosphate foam, clindamycin 1 % topical foam, Disp: , Rfl:    clobetasol ointment (TEMOVATE) 0.05 %, clobetasol 0.05 % topical ointment, Disp: , Rfl:    Dapsone 5 % topical gel, Aczone 5 % topical gel, Disp: , Rfl:    Estradiol (VAGIFEM) 10 MCG TABS vaginal tablet, Vagifem 10 mcg vaginal tablet  INSERT 1 TABLET TWICE A WEEK BY VAGINAL ROUTE FOR 14 DAYS., Disp: , Rfl:    tretinoin (RETIN-A) 0.025 % cream, tretinoin 0.025 % topical cream, Disp: , Rfl:    triamcinolone ointment (KENALOG) 0.1 %, SMARTSIG:Sparingly Topical Twice a Week, Disp: , Rfl:    buPROPion (WELLBUTRIN XL) 150 MG 24 hr tablet, Take 3 tablets (450 mg total) by mouth daily. (Patient not taking: Reported on 08/25/2022), Disp: 270 tablet, Rfl: 1   cetirizine (ZYRTEC) 10 MG tablet, Take 10 mg by mouth daily. (Patient not taking: Reported on 08/25/2022), Disp: , Rfl:    fluticasone (FLONASE) 50 MCG/ACT nasal spray, Place into both nostrils daily. (Patient not taking: Reported on 08/25/2022), Disp: , Rfl:    Pantoprazole Sodium (PROTONIX PO), Take 40 mg by mouth daily. (Patient not taking: Reported on 08/25/2022), Disp: , Rfl:    sertraline (ZOLOFT) 100 MG tablet, Take 1.5 tablets (150 mg total) by mouth daily. (Patient not taking: Reported on 08/25/2022), Disp: 135 tablet, Rfl: 1   spironolactone (ALDACTONE) 25 MG tablet, Take 25 mg by mouth daily. (Patient not taking: Reported on 08/25/2022), Disp: , Rfl:    traZODone (DESYREL) 100 MG tablet, Take 0.5-1 tablets (50-100 mg total) by mouth at bedtime as needed for sleep. (Patient not taking: Reported on 08/25/2022), Disp:  90 tablet, Rfl: 1 Medication Side Effects: none  Family Medical/ Social History: Changes? No  MENTAL HEALTH EXAM:  There were no vitals taken for this visit.There is no height or weight on file to calculate BMI.  General Appearance: Casual and Well Groomed  Eye Contact:  Good  Speech:  Clear and Coherent and Normal Rate  Volume:  Normal  Mood:  Euthymic  Affect:   Congruent  Thought Process:  Goal Directed and Descriptions of Associations: Circumstantial  Orientation:  Full (Time, Place, and Person)  Thought Content: Logical   Suicidal Thoughts:  No  Homicidal Thoughts:  No  Memory:  WNL  Judgement:  Good  Insight:  Good  Psychomotor Activity:  Normal  Concentration:  Concentration: Good and Attention Span: Good  Recall:  Good  Fund of Knowledge: Good  Language: Good  Assets:  Desire for Improvement Financial Resources/Insurance Housing Transportation Vocational/Educational  ADL's:  Intact  Cognition: WNL  Prognosis:  Good   DIAGNOSES:    ICD-10-CM   1. Obsessive-compulsive disorder, unspecified type  F42.9     2. GAD (generalized anxiety disorder)  F41.1      Receiving Psychotherapy: No     RECOMMENDATIONS:  PDMP reviewed.  No results available.   I provided 19 minutes of face to face time during this encounter, including time spent before and after the visit in records review, medical decision making, counseling pertinent to today's visit, and charting.   We discussed her diagnoses.  She seems fine off the medications so I will not restart them.  If she starts having anxiety or especially obsessive thoughts or compulsions and she has had in the past, call and I can restart the Zoloft over the phone.  Then I would have her follow up in 6 weeks or so.  Otherwise I recommend continued exercise regularly, healthy diet, good sleep hygiene. Return as needed.  Melony Overly, PA-C

## 2023-04-14 ENCOUNTER — Ambulatory Visit: Payer: 59 | Attending: Cardiology | Admitting: Cardiology

## 2023-04-14 ENCOUNTER — Encounter: Payer: Self-pay | Admitting: Cardiology

## 2023-04-14 VITALS — BP 110/80 | HR 75 | Ht 66.0 in | Wt 199.0 lb

## 2023-04-14 DIAGNOSIS — Z79899 Other long term (current) drug therapy: Secondary | ICD-10-CM

## 2023-04-14 DIAGNOSIS — R0600 Dyspnea, unspecified: Secondary | ICD-10-CM | POA: Diagnosis not present

## 2023-04-14 DIAGNOSIS — Z8249 Family history of ischemic heart disease and other diseases of the circulatory system: Secondary | ICD-10-CM | POA: Diagnosis not present

## 2023-04-14 NOTE — Progress Notes (Signed)
 Cardiology CONSULT Note    Date:  04/14/2023   ID:  Ji Fairburn, DOB 1970/03/21, MRN [id number]  PCP:  Rolinda Millman, MD  Cardiologist:  Wilbert Bihari, MD   Chief Complaint  Patient presents with   New Patient (Initial Visit)    Family history of CAD     Patient Profile: Annalie Wenner is a 53 y.o. female who is being seen today for the evaluation of family history of premature CAD at the request of Rivard, Nena, MD.  History of Present Illness:  Alleya Demeter is a 53 y.o. female who is being seen today for the evaluation of family history of CAD at the request of Rivard, Nena, MD.  Patient has a history of asthma, GERD, IBS and vitamin D deficiency.  She has no known cardiac disease of her own.  She has never smoked.  Her father had atrial fibrillation/CAD s/p CABG/chronic CHF.  He was dx with CAD at age 71yo. Her paternal GM died of heart disease in her 67's.  Her paternal aunts died of CAD as well.  She started [facility] weight management in December. She is now referred for cardiac consultation.  She denies any chest pain or pressure,  PND, orthopnea, LE edema, dizziness, palpitations or syncope. She does have SOB that she thinks is related to weight gain and deconditioning.  She can walk her dog with no SOB. She is compliant with her meds and is tolerating meds with no SE.      Past Medical History:  Diagnosis Date   Gout    History of asthma    History of gastroesophageal reflux (GERD)    History of IBS    Kidney stones    Lichen sclerosus    Renal disorder    Vitamin D deficiency     Past Surgical History:  Procedure Laterality Date   KIDNEY STONE SURGERY     1993, 2006    Current Medications: Current Meds  Medication Sig   Dapsone 5 % topical gel Aczone 5 % topical gel   Estradiol (VAGIFEM) 10 MCG TABS vaginal tablet Vagifem 10 mcg vaginal tablet  INSERT 1 TABLET TWICE A WEEK BY VAGINAL ROUTE FOR 14 DAYS.   loratadine (CLARITIN) 10 MG tablet Take 10  mg by mouth daily as needed for allergies.   Pantoprazole Sodium (PROTONIX PO) Take 40 mg by mouth as needed.   spironolactone (ALDACTONE) 25 MG tablet Take 25 mg by mouth daily.   tretinoin (RETIN-A) 0.025 % cream tretinoin 0.025 % topical cream   triamcinolone ointment (KENALOG) 0.1 % SMARTSIG:Sparingly Topical Twice a Week    Allergies:   Augmentin [amoxicillin-pot clavulanate], Codeine, and Doxycycline   Social History   Socioeconomic History   Marital status: Single    Spouse name: Not on file   Number of children: Not on file   Years of education: Not on file   Highest education level: Not on file  Occupational History   Not on file  Tobacco Use   Smoking status: Never    Passive exposure: Never   Smokeless tobacco: Never  Vaping Use   Vaping status: Never Used  Substance and Sexual Activity   Alcohol use: Not Currently   Drug use: Never   Sexual activity: Not on file  Other Topics Concern   Not on file  Social History Narrative   Not on file   Social Drivers of Health   Financial Resource Strain: Not on file  Food  Insecurity: Not on file  Transportation Needs: Not on file  Physical Activity: Not on file  Stress: Not on file  Social Connections: Not on file     Family History:  The patient's family history includes Atrial fibrillation in her father; Dementia in her mother; Esophageal cancer in her father; Heart disease in her father; Other in her father; Skin cancer in her mother.   ROS:   Please see the history of present illness.    ROS All other systems reviewed and are negative.      No data to display             PHYSICAL EXAM:   VS:  BP 110/80   Pulse 75   Ht 5' 6 (1.676 m)   Wt 199 lb (90.3 kg)   BMI 32.12 kg/m    GEN: Well nourished, well developed, in no acute distress  HEENT: normal  Neck: no JVD, carotid bruits, or masses Cardiac: RRR; no murmurs, rubs, or gallops,no edema.  Intact distal pulses bilaterally.  Respiratory:  clear  to auscultation bilaterally, normal work of breathing GI: soft, nontender, nondistended, + BS MS: no deformity or atrophy  Skin: warm and dry, no rash Neuro:  Alert and Oriented x 3, Strength and sensation are intact Psych: euthymic mood, full affect  Wt Readings from Last 3 Encounters:  04/14/23 199 lb (90.3 kg)  12/16/21 194 lb 6.4 oz (88.2 kg)      Studies/Labs Reviewed:   EKG Interpretation Date/Time:  Friday April 14 2023 13:04:09 EST Ventricular Rate:  75 PR Interval:  170 QRS Duration:  80 QT Interval:  382 QTC Calculation: 426 R Axis:   19  Text Interpretation: Sinus rhythm with Premature atrial complexes Low voltage QRS Cannot rule out Anterior infarct , age undetermined No previous ECGs available Confirmed by Shlomo Corning [id number]) on 04/14/2023 1:16:52 PMEKG Interpretation Date/Time:  Friday April 14 2023 13:04:09 EST Ventricular Rate:  75 PR Interval:  170 QRS Duration:  80 QT Interval:  382 QTC Calculation: 426 R Axis:   19  Text Interpretation: Sinus rhythm with Premature atrial complexes Low voltage QRS Cannot rule out Anterior infarct , age undetermined No previous ECGs available Confirmed by Shlomo Corning ([id number]) on 04/14/2023 1:16:52 PM     Recent Labs: No results found for requested labs within last 365 days.   Lipid Panel No results found for: CHOL, TRIG, HDL, CHOLHDL, VLDL, LDLCALC, LDLDIRECT   ASSESSMENT:    1. Family history of early CAD      PLAN:  In order of problems listed above:  Family history of premature CAD -Patient's father had cardiac disease including atrial fibrillation -Her only other cardiac risk factor is her age and mildly elevated lipids.  Her last LDL was 126 on 12/02/2022 -Recommend getting a coronary calcium score to assess future cardiac risk -Will also order an NMR Glysomed panel with LP(a) to also assess risk  SOB -I will get a 2D echo to assess LVF -she has anterior infarct forces on EKG likely  related to lead placement and if echo normal then no further workup needed  Time Spent: 20 minutes total time of encounter, including 15 minutes spent in face-to-face patient care on the date of this encounter. This time includes coordination of care and counseling regarding above mentioned problem list. Remainder of non-face-to-face time involved reviewing chart documents/testing relevant to the patient encounter and documentation in the medical record. I have independently reviewed documentation from referring provider  Followup:  PRN  Medication Adjustments/Labs and Tests Ordered: Current medicines are reviewed at length with the patient today.  Concerns regarding medicines are outlined above.  Medication changes, Labs and Tests ordered today are listed in the Patient Instructions below.  There are no Patient Instructions on file for this visit.   Signed, Wilbert Bihari, MD  04/14/2023 1:23 PM    [facility] [address] [place], [place], [place]  [address] Phone: [phone]; Fax: [phone]

## 2023-04-14 NOTE — Addendum Note (Signed)
 Addended by: Cherylyn Cos on: 04/14/2023 01:43 PM   Modules accepted: Orders

## 2023-04-14 NOTE — Patient Instructions (Signed)
 Medication Instructions:  Your physician recommends that you continue on your current medications as directed. Please refer to the Current Medication list given to you today.   *If you need a refill on your cardiac medications before your next appointment, please call your pharmacy*   Lab Work: Please complete an NMR, lipoprotein a and apolipoprotein b at any [facility] on a day when you can be fasting.  If you have labs (blood work) drawn today and your tests are completely normal, you will receive your results only by: MyChart Message (if you have MyChart) OR A paper copy in the mail If you have any lab test that is abnormal or we need to change your treatment, we will call you to review the results.   Testing/Procedures: Your physician has requested that you have an echocardiogram. Echocardiography is a painless test that uses sound waves to create images of your heart. It provides your doctor with information about the size and shape of your heart and how well your heart's chambers and valves are working. This procedure takes approximately one hour. There are no restrictions for this procedure. Please do NOT wear cologne, perfume, aftershave, or lotions (deodorant is allowed). Please arrive 15 minutes prior to your appointment time.  Please note: We ask at that you not bring children with you during ultrasound (echo/ vascular) testing. Due to room size and safety concerns, children are not allowed in the ultrasound rooms during exams. Our front office staff cannot provide observation of children in our lobby area while testing is being conducted. An adult accompanying a patient to their appointment will only be allowed in the ultrasound room at the discretion of the ultrasound technician under special circumstances. We apologize for any inconvenience.  Dr. Shlomo has ordered a coronary calcium score CT. This is a test that can provide early detection of coronary artery disease. Patient cost  is  $94-99.    Follow-Up:   Your next appointment:   1 year(s)  Provider:   Dr. Wilbert Shlomo, MD

## 2023-04-20 ENCOUNTER — Ambulatory Visit (HOSPITAL_COMMUNITY)
Admission: RE | Admit: 2023-04-20 | Discharge: 2023-04-20 | Disposition: A | Discharge status: Home / Self Care | Payer: Self-pay | Source: Ambulatory Visit | Attending: Cardiology | Admitting: Cardiology

## 2023-04-20 DIAGNOSIS — Z8249 Family history of ischemic heart disease and other diseases of the circulatory system: Secondary | ICD-10-CM | POA: Insufficient documentation

## 2023-04-21 LAB — NMR, LIPOPROFILE
Cholesterol, Total: 184 mg/dL (ref 100–199)
HDL Particle Number: 26.8 umol/L — ABNORMAL LOW (ref >=30.5)
HDL-C: 45 mg/dL (ref >39)
LDL Particle Number: 1372 nmol/L — ABNORMAL HIGH (ref <1000)
LDL Size: 21.2 nmol (ref >20.5)
LDL-C (NIH Calc): 115 mg/dL — ABNORMAL HIGH (ref 0–99)
LP-IR Score: 57 — ABNORMAL HIGH (ref <=45)
Small LDL Particle Number: 365 nmol/L (ref <=527)
Triglycerides: 134 mg/dL (ref 0–149)

## 2023-04-21 LAB — APOLIPOPROTEIN B: Apolipoprotein B: 92 mg/dL — ABNORMAL HIGH (ref <90)

## 2023-04-22 LAB — LIPOPROTEIN A (LPA): Lipoprotein (a): 20.2 nmol/L (ref <75.0)

## 2023-04-24 ENCOUNTER — Telehealth: Payer: Self-pay

## 2023-04-24 NOTE — Telephone Encounter (Signed)
-----   Message from Armanda Magic sent at 04/20/2023  8:40 PM EST ----- Coronary calcium score 0

## 2023-04-24 NOTE — Telephone Encounter (Signed)
Call to patient to go over results, no answer. No DPR on file, left message with no identifiers asking recipient to call [place] at our office #. Also sent [facility] message.

## 2023-05-04 ENCOUNTER — Ambulatory Visit (HOSPITAL_COMMUNITY): Payer: 59 | Attending: Cardiology

## 2023-05-04 DIAGNOSIS — R0600 Dyspnea, unspecified: Secondary | ICD-10-CM | POA: Insufficient documentation

## 2023-05-04 LAB — ECHOCARDIOGRAM COMPLETE
Area-P 1/2: 3.24 cm2
S' Lateral: 2.5 cm

## 2023-05-04 MED ORDER — PERFLUTREN LIPID MICROSPHERE
1.0000 mL | INTRAVENOUS | Status: AC | PRN
  Administered 2023-05-04: 2 mL via INTRAVENOUS

## 2023-05-11 ENCOUNTER — Encounter: Payer: Self-pay | Admitting: Cardiology

## 2023-05-15 ENCOUNTER — Encounter: Payer: Self-pay | Admitting: Cardiology

## 2023-05-17 NOTE — Telephone Encounter (Signed)
 I received [name]'s signed release of information and faxed it to [facility] Records Department.
